# Patient Record
Sex: Female | Born: 1935 | Race: White | Hispanic: No | Smoking: Never smoker
Health system: Southern US, Community
[De-identification: ages and names within clinical notes are randomized; demographics above are authoritative.]

## PROBLEM LIST (undated history)

## (undated) DIAGNOSIS — M1990 Unspecified osteoarthritis, unspecified site: Secondary | ICD-10-CM

## (undated) DIAGNOSIS — E785 Hyperlipidemia, unspecified: Secondary | ICD-10-CM

## (undated) DIAGNOSIS — I10 Essential (primary) hypertension: Secondary | ICD-10-CM

## (undated) HISTORY — DX: Essential (primary) hypertension: I10

## (undated) HISTORY — DX: Unspecified osteoarthritis, unspecified site: M19.90

## (undated) HISTORY — DX: Hyperlipidemia, unspecified: E78.5

## (undated) HISTORY — PX: ABDOMINAL HYSTERECTOMY: SHX81

## (undated) HISTORY — PX: BLADDER SUSPENSION: SHX72

---

## 2004-05-26 ENCOUNTER — Ambulatory Visit: Payer: Self-pay | Admitting: Family Medicine

## 2004-10-06 ENCOUNTER — Ambulatory Visit: Payer: Self-pay | Admitting: Family Medicine

## 2004-11-21 ENCOUNTER — Ambulatory Visit: Payer: Self-pay | Admitting: Gastroenterology

## 2005-05-27 ENCOUNTER — Ambulatory Visit: Payer: Self-pay | Admitting: Unknown Physician Specialty

## 2005-09-03 ENCOUNTER — Ambulatory Visit: Payer: Self-pay | Admitting: Family Medicine

## 2005-10-07 ENCOUNTER — Ambulatory Visit: Payer: Self-pay | Admitting: Family Medicine

## 2005-10-12 ENCOUNTER — Encounter: Payer: Self-pay | Admitting: Family Medicine

## 2005-10-12 ENCOUNTER — Ambulatory Visit: Payer: Self-pay | Admitting: Family Medicine

## 2005-10-12 ENCOUNTER — Other Ambulatory Visit: Admission: RE | Admit: 2005-10-12 | Discharge: 2005-10-12 | Payer: Self-pay | Admitting: Family Medicine

## 2005-10-19 ENCOUNTER — Ambulatory Visit: Payer: Self-pay | Admitting: Family Medicine

## 2005-10-20 ENCOUNTER — Ambulatory Visit: Payer: Self-pay | Admitting: Family Medicine

## 2005-12-02 ENCOUNTER — Ambulatory Visit: Payer: Self-pay | Admitting: Family Medicine

## 2006-06-30 ENCOUNTER — Ambulatory Visit: Payer: Self-pay | Admitting: Family Medicine

## 2006-07-06 ENCOUNTER — Ambulatory Visit: Payer: Self-pay | Admitting: Family Medicine

## 2006-07-27 ENCOUNTER — Ambulatory Visit: Payer: Self-pay | Admitting: Internal Medicine

## 2006-10-07 ENCOUNTER — Telehealth: Payer: Self-pay | Admitting: Family Medicine

## 2006-10-08 ENCOUNTER — Telehealth: Payer: Self-pay | Admitting: Family Medicine

## 2007-07-21 ENCOUNTER — Ambulatory Visit: Payer: Self-pay | Admitting: Internal Medicine

## 2008-12-27 ENCOUNTER — Ambulatory Visit: Payer: Self-pay | Admitting: Internal Medicine

## 2009-03-06 ENCOUNTER — Ambulatory Visit: Payer: Self-pay | Admitting: Unknown Physician Specialty

## 2009-03-19 ENCOUNTER — Ambulatory Visit: Payer: Self-pay | Admitting: Unknown Physician Specialty

## 2009-12-25 ENCOUNTER — Ambulatory Visit: Payer: Self-pay | Admitting: Internal Medicine

## 2010-01-07 ENCOUNTER — Ambulatory Visit: Payer: Self-pay | Admitting: Internal Medicine

## 2010-01-15 ENCOUNTER — Ambulatory Visit: Payer: Self-pay | Admitting: Internal Medicine

## 2010-01-22 ENCOUNTER — Ambulatory Visit: Payer: Self-pay | Admitting: Internal Medicine

## 2011-02-17 DIAGNOSIS — N390 Urinary tract infection, site not specified: Secondary | ICD-10-CM | POA: Insufficient documentation

## 2011-02-17 DIAGNOSIS — N3281 Overactive bladder: Secondary | ICD-10-CM | POA: Insufficient documentation

## 2011-10-20 ENCOUNTER — Ambulatory Visit: Payer: Self-pay | Admitting: Unknown Physician Specialty

## 2011-10-20 LAB — CREATININE, SERUM: Creatinine: 0.82 mg/dL (ref 0.60–1.30)

## 2011-12-03 ENCOUNTER — Ambulatory Visit: Payer: Self-pay | Admitting: Unknown Physician Specialty

## 2011-12-04 LAB — PATHOLOGY REPORT

## 2012-06-23 ENCOUNTER — Ambulatory Visit: Payer: Self-pay | Admitting: Ophthalmology

## 2012-07-04 ENCOUNTER — Ambulatory Visit: Payer: Self-pay | Admitting: Ophthalmology

## 2012-08-15 ENCOUNTER — Ambulatory Visit: Payer: Self-pay | Admitting: Ophthalmology

## 2013-09-15 DIAGNOSIS — R0602 Shortness of breath: Secondary | ICD-10-CM | POA: Insufficient documentation

## 2013-10-23 ENCOUNTER — Ambulatory Visit: Payer: Self-pay | Admitting: Unknown Physician Specialty

## 2013-10-24 LAB — PATHOLOGY REPORT

## 2013-11-27 DIAGNOSIS — N3946 Mixed incontinence: Secondary | ICD-10-CM | POA: Insufficient documentation

## 2013-11-27 DIAGNOSIS — R159 Full incontinence of feces: Secondary | ICD-10-CM | POA: Insufficient documentation

## 2014-02-02 DIAGNOSIS — M4807 Spinal stenosis, lumbosacral region: Secondary | ICD-10-CM | POA: Insufficient documentation

## 2014-02-02 DIAGNOSIS — M5416 Radiculopathy, lumbar region: Secondary | ICD-10-CM | POA: Insufficient documentation

## 2014-05-22 DIAGNOSIS — N3941 Urge incontinence: Secondary | ICD-10-CM | POA: Insufficient documentation

## 2014-05-22 DIAGNOSIS — N952 Postmenopausal atrophic vaginitis: Secondary | ICD-10-CM | POA: Insufficient documentation

## 2014-05-22 DIAGNOSIS — N816 Rectocele: Secondary | ICD-10-CM | POA: Insufficient documentation

## 2014-08-10 NOTE — Op Note (Signed)
PATIENT NAME:  Darlene SpellerHARRISON, Shylynn M MR#:  [id number] DATE OF BIRTH:  [birth year]  DATE OF PROCEDURE:  07/04/2012  PREOPERATIVE DIAGNOSIS:  Cataract, right eye.  POSTOPERATIVE DIAGNOSIS:  Cataract, right eye.   PROCEDURE PERFORMED:  Extracapsular cataract extraction using phacoemulsification with placement Alcon Alcon [id number], 21.0 diopter posterior chamber lens, serial number [id number]   SURGEON:  Maylon PeppersSteven A. Naftoli Penny, MD   ANESTHESIA:  4% lidocaine and 0.75% Marcaine in a 50-50 mixture with 10 units/mL of Hylenex added, given as a peribulbar.   ANESTHESIOLOGIST:  Gwenyth BenderJames Adams, MD  COMPLICATIONS:  None.   ESTIMATED BLOOD LOSS:  Less than 1 mL.   DESCRIPTION OF PROCEDURE:  The patient was brought to the operating room and given a peribulbar block.  The patient was then prepped and draped in the usual fashion.  The vertical rectus muscles were imbricated using 5-0 silk sutures.  These sutures were then clamped to the sterile drapes as bridle sutures.  A limbal peritomy was performed extending two clock hours and hemostasis was obtained with cautery.  A partial thickness scleral groove was made at the surgical limbus and dissected anteriorly in a lamellar dissection using an Alcon crescent knife.  The anterior chamber was entered superonasally with a Superblade and through the lamellar dissection with a 2.6 mm keratome.  DisCoVisc was used to replace the aqueous and a continuous tear capsulorrhexis was carried out.  Hydrodissection and hydrodelineation were carried out with balanced salt and a 27 gauge cannula.  The nucleus was rotated to confirm the effectiveness of the hydrodissection.  Phacoemulsification was carried out using a divide-and-conquer technique.  Total ultrasound time was 1 minute and 33 seconds with an average power of 23.9% and CDE 37.39 and no suture was placed.  Irrigation/aspiration was used to remove the residual cortex.  DisCoVisc was used to inflate the capsule and the  internal incision was enlarged to 3 mm with the crescent knife.  The intraocular lens was folded and inserted into the capsular bag using the AcrySert Delivery System.  Irrigation/aspiration was used to remove the residual DisCoVisc.  Miostat was injected into the anterior chamber through the paracentesis track to inflate the anterior chamber and induce miosis.  The wound was checked for leaks and none were found. The conjunctiva was closed with cautery and the bridle sutures were removed.  Two drops of 0.3% Vigamox were placed on the eye.   An eye shield was placed on the eye.  The patient was discharged to the recovery room in good condition.   ____________________________ Maylon PeppersSteven A. Panagiotis Oelkers, MD sad:si D: 07/04/2012 14:39:53 ET T: 07/04/2012 21:35:00 ET JOB#: [id number]  cc: Viviann SpareSteven A. Luan Urbani, MD, <Dictator> Erline LevineSTEVEN A Chaise Mahabir MD ELECTRONICALLY SIGNED 07/11/2012 14:15

## 2014-08-10 NOTE — Op Note (Signed)
PATIENT NAME:  Darlene Gibson, Darlene Gibson MR#:  [id number] DATE OF BIRTH:  Sep 10, 1932  DATE OF PROCEDURE:  08/15/2012  PREOPERATIVE DIAGNOSIS:  Cataract, left eye.    POSTOPERATIVE DIAGNOSIS:  Cataract, left eye.  PROCEDURE PERFORMED:  Extracapsular cataract extraction using phacoemulsification with placement of an Alcon SN6CWS, 21.5-diopter posterior chamber lens, serial #[id number].  SURGEON:  Maylon PeppersSteven A. Lester Crickenberger, MD  ASSISTANT:  None.  ANESTHESIA:  4% lidocaine and 0.75% Marcaine in a 50/50 mixture with 10 units/mL of Hylenex added, given as a peribulbar.  ANESTHESIOLOGIST:  Dr. Pernell DupreAdams.  COMPLICATIONS:  None.  ESTIMATED BLOOD LOSS:  Less than 1 ml.  DESCRIPTION OF PROCEDURE:  The patient was brought to the operating room and given a peribulbar block.  The patient was then prepped and draped in the usual fashion.  The vertical rectus muscles were imbricated using 5-0 silk sutures.  These sutures were then clamped to the sterile drapes as bridle sutures.  A limbal peritomy was performed extending two clock hours and hemostasis was obtained with cautery.  A partial thickness scleral groove was made at the surgical limbus and dissected anteriorly in a lamellar dissection using an Alcon crescent knife.  The anterior chamber was entered supero-temporally with a Superblade and through the lamellar dissection with a 2.6 mm keratome.  DisCoVisc was used to replace the aqueous and a continuous tear capsulorrhexis was carried out.  Hydrodissection and hydrodelineation were carried out with balanced salt and a 27 gauge canula.  The nucleus was rotated to confirm the effectiveness of the hydrodissection.  Phacoemulsification was carried out using a divide-and-conquer technique.  Total ultrasound time was 1 minute and 27 seconds with an average power of 26.1 percent and CDE of 39.88.  Irrigation/aspiration was used to remove the residual cortex.  DisCoVisc was used to inflate the capsule and the internal  incision was enlarged to 3 mm with the crescent knife.  The intraocular lens was folded and inserted into the capsular bag using the AcrySert delivery system. Irrigation/aspiration was used to remove the residual DisCoVisc.  Miostat was injected into the anterior chamber through the paracentesis track to inflate the anterior chamber and induce miosis.  The wound was checked for leaks and none were found. The conjunctiva was closed with cautery and the bridle sutures were removed.  Two drops of 0.3% Vigamox were placed on the eye.   An eye shield was placed on the eye.  The patient was discharged to the recovery room in good condition.  ____________________________ Maylon PeppersSteven A. Shelisha Gautier, MD sad:sb D: 08/15/2012 14:04:14 ET T: 08/15/2012 14:47:11 ET JOB#: [id number]  cc: Viviann SpareSteven A. Tiani Stanbery, MD, <Dictator> Erline LevineSTEVEN A Camika Marsico MD ELECTRONICALLY SIGNED 08/22/2012 9:47

## 2014-09-03 DIAGNOSIS — E782 Mixed hyperlipidemia: Secondary | ICD-10-CM | POA: Insufficient documentation

## 2014-11-13 DIAGNOSIS — M47812 Spondylosis without myelopathy or radiculopathy, cervical region: Secondary | ICD-10-CM | POA: Insufficient documentation

## 2014-11-13 DIAGNOSIS — M5030 Other cervical disc degeneration, unspecified cervical region: Secondary | ICD-10-CM | POA: Insufficient documentation

## 2014-11-13 DIAGNOSIS — M62838 Other muscle spasm: Secondary | ICD-10-CM | POA: Insufficient documentation

## 2015-01-16 ENCOUNTER — Other Ambulatory Visit: Payer: Self-pay | Admitting: Internal Medicine

## 2015-01-16 DIAGNOSIS — Z1231 Encounter for screening mammogram for malignant neoplasm of breast: Secondary | ICD-10-CM

## 2015-01-18 ENCOUNTER — Ambulatory Visit: Payer: Self-pay | Attending: Internal Medicine

## 2015-01-21 ENCOUNTER — Other Ambulatory Visit: Payer: Self-pay | Admitting: Internal Medicine

## 2015-01-21 ENCOUNTER — Ambulatory Visit
Admission: RE | Admit: 2015-01-21 | Discharge: 2015-01-21 | Disposition: A | Discharge status: Home / Self Care | Payer: Medicare Other | Source: Ambulatory Visit | Attending: Internal Medicine | Admitting: Internal Medicine

## 2015-01-21 DIAGNOSIS — Z1231 Encounter for screening mammogram for malignant neoplasm of breast: Secondary | ICD-10-CM | POA: Insufficient documentation

## 2015-03-29 ENCOUNTER — Telehealth (HOSPITAL_COMMUNITY): Payer: Self-pay | Admitting: Physical Medicine and Rehabilitation

## 2015-03-29 ENCOUNTER — Other Ambulatory Visit: Payer: Self-pay | Admitting: Physical Medicine and Rehabilitation

## 2015-03-29 DIAGNOSIS — M5416 Radiculopathy, lumbar region: Secondary | ICD-10-CM

## 2015-04-19 ENCOUNTER — Ambulatory Visit: Payer: Medicare Other

## 2015-04-22 ENCOUNTER — Ambulatory Visit: Payer: Medicare Other

## 2015-04-30 ENCOUNTER — Ambulatory Visit
Admission: RE | Admit: 2015-04-30 | Discharge: 2015-04-30 | Disposition: A | Discharge status: Home / Self Care | Payer: Medicare Other | Source: Ambulatory Visit | Attending: Physical Medicine and Rehabilitation | Admitting: Physical Medicine and Rehabilitation

## 2015-04-30 DIAGNOSIS — M5126 Other intervertebral disc displacement, lumbar region: Secondary | ICD-10-CM | POA: Insufficient documentation

## 2015-04-30 DIAGNOSIS — M5416 Radiculopathy, lumbar region: Secondary | ICD-10-CM | POA: Diagnosis present

## 2015-09-24 DIAGNOSIS — I10 Essential (primary) hypertension: Secondary | ICD-10-CM | POA: Insufficient documentation

## 2016-01-20 DIAGNOSIS — K580 Irritable bowel syndrome with diarrhea: Secondary | ICD-10-CM | POA: Insufficient documentation

## 2016-02-13 ENCOUNTER — Ambulatory Visit: Payer: Self-pay

## 2016-02-21 ENCOUNTER — Encounter: Payer: Self-pay | Admitting: Urology

## 2016-02-21 ENCOUNTER — Ambulatory Visit (INDEPENDENT_AMBULATORY_CARE_PROVIDER_SITE_OTHER): Payer: Medicare Other | Admitting: Urology

## 2016-02-21 ENCOUNTER — Encounter: Payer: Self-pay | Admitting: Family Medicine

## 2016-02-21 ENCOUNTER — Other Ambulatory Visit: Payer: Self-pay | Admitting: Family Medicine

## 2016-02-21 VITALS — BP 146/80 | HR 73 | Ht 66.0 in | Wt 185.6 lb

## 2016-02-21 DIAGNOSIS — K580 Irritable bowel syndrome with diarrhea: Secondary | ICD-10-CM | POA: Diagnosis not present

## 2016-02-21 DIAGNOSIS — N3281 Overactive bladder: Secondary | ICD-10-CM

## 2016-02-21 DIAGNOSIS — N390 Urinary tract infection, site not specified: Secondary | ICD-10-CM | POA: Diagnosis not present

## 2016-02-21 LAB — URINALYSIS, COMPLETE
Bilirubin, UA: NEGATIVE
Glucose, UA: NEGATIVE
Ketones, UA: NEGATIVE
Nitrite, UA: POSITIVE — AB
PH UA: 6.5 (ref 5.0–7.5)
PROTEIN UA: NEGATIVE
Specific Gravity, UA: 1.02 (ref 1.005–1.030)
UUROB: 0.2 mg/dL (ref 0.2–1.0)

## 2016-02-21 LAB — BLADDER SCAN AMB NON-IMAGING: SCAN RESULT: 19

## 2016-02-21 LAB — MICROSCOPIC EXAMINATION: Epithelial Cells (non renal): >10 /hpf — AB (ref 0–10)

## 2016-02-21 MED ORDER — MIRABEGRON ER 25 MG PO TB24: 25.0000 mg | ORAL_TABLET | Freq: Every day | ORAL | 11 refills | Status: DC

## 2016-02-21 NOTE — Progress Notes (Signed)
02/21/2016 9:07 AM   Darlene SkinnerPhoebe Jana HalfM Gibson [birth year] [id number]  Referring provider: Theadore NanKimberly A Mills, NP [phone] [facility] [address] [place], [place] [address]  Chief Complaint  Patient presents with  . New Patient (Initial Visit)    recurrent UTI    HPI: The patient is an 82 year old female with PMH of sling in 1996 and IBS who presents for evaluation of recurrent urinary tract infections. The patient however feels that she is here to discuss her diarrhea symptoms. Her biggest complaint is suprapubic discomfort and diarrhea tendons after eating which she finds socially bothersome. She does not think that she has seen a gastroenterologist before.  In regards to her urinary symptoms, she states that she feels like she has a UTI at least once a month which she describes her symptoms being suprapubic discomfort. Her other urinary complaints at this time is urinary frequency with urge incontinence. She finds this mildly bothersome but not nearly as bothersome as her diarrhea.  In review of her culture data, she has had multiple urine cultures signs within the last year., However she has only had 1 positive culture which was in December 2016 which grew Escherichia coli. All remaining cultures were negative or contaminated.    PVR: 19 cc  PMH: Past Medical History:  Diagnosis Date  . Arthritis   . Hyperlipidemia     Surgical History: Past Surgical History:  Procedure Laterality Date  . BLADDER SUSPENSION      Home Medications:    Medication List       Accurate as of 02/21/16  9:07 AM. Always use your most recent med list.          acetaminophen 325 MG tablet Commonly known as:  TYLENOL Take 325 mg by mouth every 6 (six) hours as needed.   amitriptyline 25 MG tablet Commonly known as:  ELAVIL Take by mouth.   loperamide 2 MG tablet Commonly known as:  IMODIUM A-D Take 2 mg by mouth 4 (four) times daily as needed for diarrhea or loose stools.     mirabegron ER 25 MG Tb24 tablet Commonly known as:  MYRBETRIQ Take 1 tablet (25 mg total) by mouth daily.   VITAMIN D-1000 MAX ST 1000 units tablet Generic drug:  Cholecalciferol Take by mouth.       Allergies:  Allergies  Allergen Reactions  . Penicillins Other (See Comments)    Hallucination  . Pravastatin     Other reaction(s): Hallucination  . Prednisone Other (See Comments)    My body felt like it was on fire  . Other Rash    Poison oak/ivy    Family History: Family History  Problem Relation Age of Onset  . Breast cancer Mother 2570  . Hematuria Neg Hx   . Renal cancer Neg Hx     Social History:  has no tobacco, alcohol, and drug history on file.  ROS: UROLOGY Frequent Urination?: Yes Hard to postpone urination?: Yes Burning/pain with urination?: Yes Get up at night to urinate?: Yes Leakage of urine?: Yes Urine stream starts and stops?: Yes Trouble starting stream?: No Do you have to strain to urinate?: No Blood in urine?: No Urinary tract infection?: Yes Sexually transmitted disease?: No Injury to kidneys or bladder?: No Painful intercourse?: No Weak stream?: No Currently pregnant?: No Vaginal bleeding?: No Last menstrual period?: No  Gastrointestinal Nausea?: No Vomiting?: No Indigestion/heartburn?: No Diarrhea?: Yes Constipation?: No  Constitutional Fever: No Night sweats?: No Weight loss?: No Fatigue?: Yes  Skin Skin rash/lesions?: No Itching?: No  Eyes Blurred vision?: Yes Double vision?: No  Ears/Nose/Throat Sore throat?: No Sinus problems?: Yes  Hematologic/Lymphatic Swollen glands?: No Easy bruising?: Yes  Cardiovascular Leg swelling?: No Chest pain?: No  Respiratory Cough?: Yes Shortness of breath?: Yes  Endocrine Excessive thirst?: Yes  Musculoskeletal Back pain?: Yes Joint pain?: Yes  Neurological Headaches?: No Dizziness?: No  Psychologic Depression?: No Anxiety?: No  Physical Exam: BP (!)  146/80   Pulse 73   Ht 5\' 6"  (1.676 m)   Wt 185 lb 9.6 oz (84.2 kg)   BMI 29.96 kg/m   Constitutional:  Alert and oriented, No acute distress. HEENT: [place] AT, moist mucus membranes.  Trachea midline, no masses. Cardiovascular: No clubbing, cyanosis, or edema. Respiratory: Normal respiratory effort, no increased work of breathing. GI: Abdomen is soft, nontender, nondistended, no abdominal masses GU: No CVA tenderness.  Skin: No rashes, bruises or suspicious lesions. Lymph: No cervical or inguinal adenopathy. Neurologic: Grossly intact, no focal deficits, moving all 4 extremities. Psychiatric: Normal mood and affect.  Laboratory Data: No results found for: WBC, HGB, HCT, MCV, PLT  Lab Results  Component Value Date   CREATININE 0.82 10/20/2011    No results found for: PSA  No results found for: TESTOSTERONE  No results found for: HGBA1C  Urinalysis No results found for: COLORURINE, APPEARANCEUR, LABSPEC, PHURINE, GLUCOSEU, HGBUR, BILIRUBINUR, KETONESUR, PROTEINUR, UROBILINOGEN, NITRITE, LEUKOCYTESUR   Assessment & Plan:   1. OAB 2. IBS I do not think the patient has recurrent urinary tract infections based on her symptoms and culture results. Her suprapubic discomfort that she describes is likely related to her irritable bowel syndrome. She does suffer from urinary urgency with urge incontinence. I have started her on Myrbetriq 25 mg daily. This should help with her OAB symptoms. Gastroenterology referral may be helpful for management of her suprapubic discomfort and diarrhea.  She'll follow up in 3 months to see how Myrbetriq helps her urinary urgency with incontinence.  Return in about 3 months (around 05/23/2016).  Hildred LaserBrian James Cniyah Sproull, MD  [facility] [address], [address] [place], [place] [address] [phone]

## 2016-02-26 ENCOUNTER — Telehealth: Payer: Self-pay

## 2016-02-26 NOTE — Telephone Encounter (Signed)
PA for myrbetriq DENIED. Insurance wants pt to try other medications.

## 2016-05-25 ENCOUNTER — Ambulatory Visit: Payer: Medicare Other

## 2016-08-27 ENCOUNTER — Other Ambulatory Visit: Payer: Self-pay | Admitting: Internal Medicine

## 2016-08-27 DIAGNOSIS — K5792 Diverticulitis of intestine, part unspecified, without perforation or abscess without bleeding: Secondary | ICD-10-CM

## 2016-09-03 ENCOUNTER — Ambulatory Visit
Admission: RE | Admit: 2016-09-03 | Discharge: 2016-09-03 | Disposition: A | Discharge status: Home / Self Care | Payer: Medicare Other | Source: Ambulatory Visit | Attending: Internal Medicine | Admitting: Internal Medicine

## 2016-09-03 DIAGNOSIS — Z9049 Acquired absence of other specified parts of digestive tract: Secondary | ICD-10-CM | POA: Insufficient documentation

## 2016-09-03 DIAGNOSIS — K5792 Diverticulitis of intestine, part unspecified, without perforation or abscess without bleeding: Secondary | ICD-10-CM | POA: Diagnosis present

## 2016-09-03 DIAGNOSIS — K769 Liver disease, unspecified: Secondary | ICD-10-CM | POA: Insufficient documentation

## 2016-09-03 DIAGNOSIS — Z90710 Acquired absence of both cervix and uterus: Secondary | ICD-10-CM | POA: Diagnosis not present

## 2016-09-03 DIAGNOSIS — K5730 Diverticulosis of large intestine without perforation or abscess without bleeding: Secondary | ICD-10-CM | POA: Insufficient documentation

## 2016-09-03 MED ORDER — IOPAMIDOL (ISOVUE-300) INJECTION 61%
100.0000 mL | Freq: Once | INTRAVENOUS | Status: AC | PRN
  Administered 2016-09-03: 100 mL via INTRAVENOUS

## 2017-03-08 ENCOUNTER — Other Ambulatory Visit: Payer: Self-pay | Admitting: Nurse Practitioner

## 2017-03-08 DIAGNOSIS — K769 Liver disease, unspecified: Secondary | ICD-10-CM

## 2017-03-15 ENCOUNTER — Ambulatory Visit
Admission: RE | Admit: 2017-03-15 | Discharge: 2017-03-15 | Disposition: A | Discharge status: Home / Self Care | Payer: Medicare Other | Source: Ambulatory Visit | Attending: Nurse Practitioner | Admitting: Nurse Practitioner

## 2017-03-15 DIAGNOSIS — K769 Liver disease, unspecified: Secondary | ICD-10-CM | POA: Insufficient documentation

## 2017-03-15 LAB — POCT I-STAT CREATININE: Creatinine, Ser: 0.7 mg/dL (ref 0.44–1.00)

## 2017-03-15 MED ORDER — GADOBENATE DIMEGLUMINE 529 MG/ML IV SOLN
17.0000 mL | Freq: Once | INTRAVENOUS | Status: AC | PRN
  Administered 2017-03-15: 17 mL via INTRAVENOUS

## 2017-05-28 ENCOUNTER — Encounter: Payer: Self-pay | Admitting: Emergency Medicine

## 2017-05-28 ENCOUNTER — Other Ambulatory Visit: Payer: Self-pay

## 2017-05-28 ENCOUNTER — Emergency Department
Admission: EM | Admit: 2017-05-28 | Discharge: 2017-05-29 | Disposition: A | Discharge status: Home / Self Care | Payer: Medicare Other | Attending: Emergency Medicine | Admitting: Emergency Medicine

## 2017-05-28 DIAGNOSIS — R350 Frequency of micturition: Secondary | ICD-10-CM | POA: Diagnosis not present

## 2017-05-28 DIAGNOSIS — R945 Abnormal results of liver function studies: Secondary | ICD-10-CM | POA: Diagnosis not present

## 2017-05-28 DIAGNOSIS — I10 Essential (primary) hypertension: Secondary | ICD-10-CM | POA: Diagnosis not present

## 2017-05-28 DIAGNOSIS — R110 Nausea: Secondary | ICD-10-CM | POA: Diagnosis present

## 2017-05-28 DIAGNOSIS — R51 Headache: Secondary | ICD-10-CM | POA: Diagnosis not present

## 2017-05-28 DIAGNOSIS — R7989 Other specified abnormal findings of blood chemistry: Secondary | ICD-10-CM

## 2017-05-28 DIAGNOSIS — R1030 Lower abdominal pain, unspecified: Secondary | ICD-10-CM | POA: Diagnosis not present

## 2017-05-28 DIAGNOSIS — N3000 Acute cystitis without hematuria: Secondary | ICD-10-CM | POA: Insufficient documentation

## 2017-05-28 LAB — URINALYSIS, COMPLETE (UACMP) WITH MICROSCOPIC
Bilirubin Urine: NEGATIVE
GLUCOSE, UA: NEGATIVE mg/dL
HGB URINE DIPSTICK: NEGATIVE
KETONES UR: 5 mg/dL — AB
NITRITE: NEGATIVE
PH: 5 (ref 5.0–8.0)
Protein, ur: 30 mg/dL — AB
Specific Gravity, Urine: 1.021 (ref 1.005–1.030)

## 2017-05-28 LAB — CBC
HCT: 41.6 % (ref 35.0–47.0)
Hemoglobin: 13.8 g/dL (ref 12.0–16.0)
MCH: 29.1 pg (ref 26.0–34.0)
MCHC: 33.1 g/dL (ref 32.0–36.0)
MCV: 88.2 fL (ref 80.0–100.0)
PLATELETS: 200 10*3/uL (ref 150–440)
RBC: 4.72 MIL/uL (ref 3.80–5.20)
RDW: 14.1 % (ref 11.5–14.5)
WBC: 13.9 10*3/uL — AB (ref 3.6–11.0)

## 2017-05-28 LAB — COMPREHENSIVE METABOLIC PANEL
ALK PHOS: 62 U/L (ref 38–126)
ALT: 86 U/L — AB (ref 14–54)
ANION GAP: 10 (ref 5–15)
AST: 94 U/L — ABNORMAL HIGH (ref 15–41)
Albumin: 3.8 g/dL (ref 3.5–5.0)
BILIRUBIN TOTAL: 0.9 mg/dL (ref 0.3–1.2)
BUN: 14 mg/dL (ref 6–20)
CALCIUM: 8.9 mg/dL (ref 8.9–10.3)
CO2: 24 mmol/L (ref 22–32)
CREATININE: 0.8 mg/dL (ref 0.44–1.00)
Chloride: 103 mmol/L (ref 101–111)
GFR calc Af Amer: >60 mL/min (ref >60)
Glucose, Bld: 182 mg/dL — ABNORMAL HIGH (ref 65–99)
Potassium: 3.5 mmol/L (ref 3.5–5.1)
Sodium: 137 mmol/L (ref 135–145)
TOTAL PROTEIN: 6.6 g/dL (ref 6.5–8.1)

## 2017-05-28 LAB — LIPASE, BLOOD: Lipase: 25 U/L (ref 11–51)

## 2017-05-28 MED ORDER — ACETAMINOPHEN 500 MG PO TABS
1000.0000 mg | ORAL_TABLET | Freq: Once | ORAL | Status: DC
  Filled 2017-05-28: qty 2

## 2017-05-28 MED ORDER — CEPHALEXIN 500 MG PO CAPS: 500.0000 mg | ORAL_CAPSULE | Freq: Four times a day (QID) | ORAL | 0 refills | Status: AC

## 2017-05-28 MED ORDER — SODIUM CHLORIDE 0.9 % IV BOLUS (SEPSIS)
1000.0000 mL | Freq: Once | INTRAVENOUS | Status: AC
  Administered 2017-05-28: 1000 mL via INTRAVENOUS

## 2017-05-28 MED ORDER — ONDANSETRON 4 MG PO TBDP: 4.0000 mg | ORAL_TABLET | Freq: Three times a day (TID) | ORAL | 0 refills | Status: DC | PRN

## 2017-05-28 MED ORDER — DEXTROSE 5 % IV SOLN
1.0000 g | Freq: Once | INTRAVENOUS | Status: AC
  Administered 2017-05-28: 1 g via INTRAVENOUS
  Filled 2017-05-28: qty 10

## 2017-05-28 NOTE — ED Notes (Signed)
Patient and patient's daughter ambulated up to triage desk to ask about wait times. Patient informed that specific times could not be given, however, patient would be called when a room was available for her. Patient and daughter verbalized understanding.

## 2017-05-28 NOTE — ED Notes (Addendum)
Pt takes marobid for tx of UTI. Pt is NAD. Pt states she feels normal now. Pt daugther states she has not ate all day or drank anything. Pt has a hx of IBS and states it is not a flair up. Pt states HA that started 3 days ago. Pt states the HA feels like she feels pain in the "neck behind ear, and makes her head feel real big." Pt is NAD at this time.

## 2017-05-28 NOTE — ED Triage Notes (Signed)
Pt presents to the ER from home with complaints of nausea and urinary symptoms, reports she went to walking clinic and was prescribed Nitrofurantoin 100mg  BID reports took only 2 doses reports and started to feel sick on her stomach. Pt reports some headache and sinus pressure, body aches denise any temperature

## 2017-05-28 NOTE — ED Notes (Signed)
Patient ambulated up to triage desk, steady gait asking about wait times. Patient informed that there are still 2 patients in lobby to go back before her. Patient asked about specific wait time. This RN informed patient specific times cannot be given. Patient verbalized understanding.

## 2017-05-28 NOTE — ED Notes (Addendum)
Patient ambulated up to front desk asking about wait time and why patients were being pulled ahead of her. RN spoke with patient about triage process and room availability. Patient verbalized understanding.

## 2017-05-28 NOTE — ED Provider Notes (Signed)
[facility] Emergency Department Provider Note  ____________________________________________  Time seen: Approximately 10:40 PM  I have reviewed the triage vital signs and the nursing notes.   HISTORY  Chief Complaint Nausea; Facial Pain; and Urinary Frequency    HPI Darlene Gibson is a 84 y.o. female with a history of HTN, IBS, presenting with nausea after taking Macrobid.  The patient had some mild suprapubic discomfort yesterday and was seen by her PMD, prescribed with Macrobid for UTI.  She took her first dose at 3:30 PM, and developed fairly significant nausea without vomiting immediately after taking the tablet.  The same thing happened when she took the next tablet at 3:30 AM.  The patient denies any fevers, chills, diarrhea, or abdominal pain at this time.  She does have a associated headache and has not tried anything for her pain.  The patient states that while waiting in the waiting room, she was feeling significantly better and wanted to leave but her family would not let her.  Past Medical History:  Diagnosis Date  . Arthritis   . Hyperlipidemia     Patient Active Problem List   Diagnosis Date Noted  . Irritable bowel syndrome with diarrhea 01/20/2016  . Hypertension 09/24/2015  . DDD (degenerative disc disease), cervical 11/13/2014  . Muscle spasms of neck 11/13/2014  . Spondylosis of cervical region without myelopathy or radiculopathy 11/13/2014  . Hyperlipidemia, mixed 09/03/2014  . Rectocele 05/22/2014  . Urinary incontinence, urge 05/22/2014  . Vaginal atrophy 05/22/2014  . Foraminal stenosis of lumbosacral region 02/02/2014  . Lumbar radiculitis 02/02/2014  . Fecal incontinence 11/27/2013  . Mixed stress and urge urinary incontinence 11/27/2013  . SOB (shortness of breath) 09/15/2013  . OAB (overactive bladder) 02/17/2011  . Recurrent UTI 02/17/2011    Past Surgical History:  Procedure Laterality Date  . BLADDER SUSPENSION       Current Outpatient Rx  . Order #: [id number] Class: Historical Med  . Order #: [id number] Class: Historical Med  . Order #: [id number] Class: Print  . Order #: [id number] Class: Historical Med  . Order #: [id number] Class: Historical Med  . Order #: [id number] Class: Print  . Order #: [id number] Class: Print    Allergies Penicillins; Pravastatin; Prednisone; and Other  Family History  Problem Relation Age of Onset  . Breast cancer Mother 77  . Hematuria Neg Hx   . Renal cancer Neg Hx     Social History Social History   Tobacco Use  . Smoking status: Never Smoker  Substance Use Topics  . Alcohol use: No    Frequency: Never  . Drug use: No    Review of Systems Constitutional: No fever/chills.  P. Eyes: No visual changes. ENT: No sore throat. No congestion or rhinorrhea. Cardiovascular: Denies chest pain. Denies palpitations. Respiratory: Denies shortness of breath.  No cough. Gastrointestinal: Positive mild suprapubic abdominal pain.  Positive nausea, no vomiting.  No diarrhea.  No constipation. Genitourinary: Negative for dysuria. Musculoskeletal: Negative for back pain. Skin: Negative for rash. Neurological: Negative for headaches. No focal numbness, tingling or weakness.     ____________________________________________   PHYSICAL EXAM:  VITAL SIGNS: ED Triage Vitals  Enc Vitals Group     BP 05/28/17 1908 (!) 138/55     Pulse Rate 05/28/17 1908 87     Resp 05/28/17 1908 20     Temp 05/28/17 1908 98.9 F (37.2 C)     Temp Source 05/28/17 1908 Oral     SpO2 05/28/17 1908 96 %  Weight 05/28/17 1912 180 lb (81.6 kg)     Height 05/28/17 1912 5\' 6"  (1.676 m)     Head Circumference --      Peak Flow --      Pain Score 05/28/17 1911 6     Pain Loc --      Pain Edu? --      Excl. in GC? --     Constitutional: Alert and oriented. Well appearing and in no acute distress. Answers questions appropriately. Eyes: Conjunctivae are normal.  EOMI. No scleral  icterus. Head: Atraumatic. Nose: No congestion/rhinnorhea. Mouth/Throat: Mucous membranes are moist.  Neck: No stridor.  Supple.   Cardiovascular: Normal rate, regular rhythm. No murmurs, rubs or gallops.  Respiratory: Normal respiratory effort.  No accessory muscle use or retractions. Lungs CTAB.  No wheezes, rales or ronchi. Gastrointestinal: Obese.  Soft, nontender and nondistended.  No guarding or rebound.  No peritoneal signs. Musculoskeletal: No LE edema. Neurologic:  A&Ox3.  Speech is clear.  Face and smile are symmetric.  EOMI.  Moves all extremities well. Skin:  Skin is warm, dry and intact. No rash noted. Psychiatric: Mood and affect are normal. Speech and behavior are normal.  Normal judgement.  ____________________________________________   LABS (all labs ordered are listed, but only abnormal results are displayed)  Labs Reviewed  COMPREHENSIVE METABOLIC PANEL - Abnormal; Notable for the following components:      Result Value   Glucose, Bld 182 (*)    AST 94 (*)    ALT 86 (*)    All other components within normal limits  CBC - Abnormal; Notable for the following components:   WBC 13.9 (*)    All other components within normal limits  URINALYSIS, COMPLETE (UACMP) WITH MICROSCOPIC - Abnormal; Notable for the following components:   Color, Urine AMBER (*)    APPearance HAZY (*)    Ketones, ur 5 (*)    Protein, ur 30 (*)    Leukocytes, UA TRACE (*)    Bacteria, UA RARE (*)    Squamous Epithelial / LPF 6-30 (*)    All other components within normal limits  URINE CULTURE  LIPASE, BLOOD   ____________________________________________  EKG  Not indicated ____________________________________________  RADIOLOGY  No results found.  ____________________________________________   PROCEDURES  Procedure(s) performed: None  Procedures  Critical Care performed: No ____________________________________________   INITIAL IMPRESSION / ASSESSMENT AND PLAN / ED  COURSE  Pertinent labs & imaging results that were available during my care of the patient were reviewed by me and considered in my medical decision making (see chart for details).  84 y.o. female started on Macrobid for UTI yesterday with nausea associated with taking Macrobid.  Overall, the patient is hemodynamically stable and afebrile.  It is possible that she had a side effect to her Macrobid, or that she was feeling nauseated from her underlying infection.  Here, the patient does have ongoing bacteria in her urine and we will treat her with a gram of Rocephin.  She will be treated with Tylenol for her mild headache.  I do not see any evidence of acute intra-abdominal pathology, sepsis or severe infection.  We will have the patient discontinue her Macrobid and take Keflex for her UTI.  A urine culture has been sent.  The patient will be discharged with Zofran as well as return precautions and follow-up instructions.  She does know that her LFTs were mildly elevated today and that she will need to have these rechecked with  her primary care physician.  ____________________________________________  FINAL CLINICAL IMPRESSION(S) / ED DIAGNOSES  Final diagnoses:  Acute cystitis without hematuria  Nausea  Abnormal LFTs         NEW MEDICATIONS STARTED DURING THIS VISIT:  New Prescriptions   CEPHALEXIN (KEFLEX) 500 MG CAPSULE    Take 1 capsule (500 mg total) by mouth 4 (four) times daily for 5 days.   ONDANSETRON (ZOFRAN ODT) 4 MG DISINTEGRATING TABLET    Take 1 tablet (4 mg total) by mouth every 8 (eight) hours as needed for nausea or vomiting.      Rockne Menghini, MD 05/28/17 2249

## 2017-05-28 NOTE — Discharge Instructions (Signed)
Please drink plenty of fluids to stay well-hydrated.  STOP TAKING MACROBID and start taking Keflex for your urinary tract infection.  Please take the entire course of antibiotics, even if you are feeling better.  A urine culture has been sent; please have your primary care physician follow-up the results of your urine culture.  Your primary care physician will also need to recheck your liver function tests, which were slightly abnormal today.  You may take Tylenol or Motrin for fever or pain.  Return to the emergency department if you develop severe pain, fever, inability to keep down fluids, or any other symptoms concerning to you.

## 2017-05-28 NOTE — ED Notes (Signed)
Patient's daughter up to triage desk to ask about wait times. Patient's daughter informed that patient would be going to a room soon, as the rooms were currently being cleaned. Patient's daughter verbalized understanding.

## 2017-05-30 LAB — URINE CULTURE

## 2017-06-08 ENCOUNTER — Other Ambulatory Visit: Payer: Self-pay | Admitting: Internal Medicine

## 2017-06-08 DIAGNOSIS — R945 Abnormal results of liver function studies: Secondary | ICD-10-CM

## 2017-06-08 DIAGNOSIS — R7989 Other specified abnormal findings of blood chemistry: Secondary | ICD-10-CM

## 2017-06-15 ENCOUNTER — Ambulatory Visit
Admission: RE | Admit: 2017-06-15 | Discharge: 2017-06-15 | Disposition: A | Discharge status: Home / Self Care | Payer: Medicare Other | Source: Ambulatory Visit | Attending: Internal Medicine | Admitting: Internal Medicine

## 2017-06-15 DIAGNOSIS — R945 Abnormal results of liver function studies: Secondary | ICD-10-CM | POA: Diagnosis not present

## 2017-06-15 DIAGNOSIS — R932 Abnormal findings on diagnostic imaging of liver and biliary tract: Secondary | ICD-10-CM | POA: Insufficient documentation

## 2017-06-15 DIAGNOSIS — R7989 Other specified abnormal findings of blood chemistry: Secondary | ICD-10-CM

## 2017-09-08 ENCOUNTER — Other Ambulatory Visit: Payer: Self-pay | Admitting: Internal Medicine

## 2017-09-08 DIAGNOSIS — R1084 Generalized abdominal pain: Secondary | ICD-10-CM

## 2017-09-16 ENCOUNTER — Ambulatory Visit
Admission: RE | Admit: 2017-09-16 | Discharge: 2017-09-16 | Disposition: A | Discharge status: Home / Self Care | Payer: Medicare Other | Source: Ambulatory Visit | Attending: Internal Medicine | Admitting: Internal Medicine

## 2017-09-16 DIAGNOSIS — R1084 Generalized abdominal pain: Secondary | ICD-10-CM | POA: Diagnosis present

## 2017-09-16 DIAGNOSIS — K5730 Diverticulosis of large intestine without perforation or abscess without bleeding: Secondary | ICD-10-CM | POA: Diagnosis not present

## 2017-09-16 DIAGNOSIS — K769 Liver disease, unspecified: Secondary | ICD-10-CM | POA: Insufficient documentation

## 2017-09-16 DIAGNOSIS — Z9889 Other specified postprocedural states: Secondary | ICD-10-CM | POA: Insufficient documentation

## 2017-09-16 MED ORDER — IOPAMIDOL (ISOVUE-300) INJECTION 61%
100.0000 mL | Freq: Once | INTRAVENOUS | Status: AC | PRN
  Administered 2017-09-16: 100 mL via INTRAVENOUS

## 2018-11-01 ENCOUNTER — Other Ambulatory Visit
Admission: RE | Admit: 2018-11-01 | Discharge: 2018-11-01 | Disposition: A | Discharge status: Home / Self Care | Payer: Medicare Other | Source: Ambulatory Visit | Attending: Nurse Practitioner | Admitting: Nurse Practitioner

## 2018-11-01 DIAGNOSIS — K529 Noninfective gastroenteritis and colitis, unspecified: Secondary | ICD-10-CM | POA: Diagnosis present

## 2018-11-01 DIAGNOSIS — R195 Other fecal abnormalities: Secondary | ICD-10-CM | POA: Diagnosis present

## 2018-11-01 DIAGNOSIS — R1030 Lower abdominal pain, unspecified: Secondary | ICD-10-CM | POA: Diagnosis present

## 2018-11-04 LAB — CALPROTECTIN, FECAL: Calprotectin, Fecal: 227 ug/g — ABNORMAL HIGH (ref 0–120)

## 2018-11-12 ENCOUNTER — Other Ambulatory Visit
Admission: RE | Admit: 2018-11-12 | Discharge: 2018-11-12 | Disposition: A | Discharge status: Home / Self Care | Payer: Medicare Other | Source: Ambulatory Visit | Attending: Nurse Practitioner | Admitting: Nurse Practitioner

## 2018-11-12 DIAGNOSIS — R899 Unspecified abnormal finding in specimens from other organs, systems and tissues: Secondary | ICD-10-CM | POA: Insufficient documentation

## 2018-11-12 DIAGNOSIS — R197 Diarrhea, unspecified: Secondary | ICD-10-CM | POA: Insufficient documentation

## 2018-11-12 LAB — GASTROINTESTINAL PANEL BY PCR, STOOL (REPLACES STOOL CULTURE)

## 2018-11-12 LAB — C DIFFICILE QUICK SCREEN W PCR REFLEX
C Diff antigen: NEGATIVE
C Diff interpretation: NOT DETECTED
C Diff toxin: NEGATIVE

## 2019-01-07 ENCOUNTER — Emergency Department: Payer: Medicare Other

## 2019-01-07 ENCOUNTER — Emergency Department
Admission: EM | Admit: 2019-01-07 | Discharge: 2019-01-07 | Disposition: A | Discharge status: Home / Self Care | Payer: Medicare Other | Attending: Emergency Medicine | Admitting: Emergency Medicine

## 2019-01-07 ENCOUNTER — Other Ambulatory Visit: Payer: Self-pay

## 2019-01-07 DIAGNOSIS — I10 Essential (primary) hypertension: Secondary | ICD-10-CM | POA: Diagnosis not present

## 2019-01-07 DIAGNOSIS — N3090 Cystitis, unspecified without hematuria: Secondary | ICD-10-CM | POA: Diagnosis not present

## 2019-01-07 DIAGNOSIS — S0003XA Contusion of scalp, initial encounter: Secondary | ICD-10-CM

## 2019-01-07 DIAGNOSIS — R55 Syncope and collapse: Secondary | ICD-10-CM | POA: Diagnosis not present

## 2019-01-07 DIAGNOSIS — Y9389 Activity, other specified: Secondary | ICD-10-CM | POA: Diagnosis not present

## 2019-01-07 DIAGNOSIS — N3000 Acute cystitis without hematuria: Secondary | ICD-10-CM

## 2019-01-07 DIAGNOSIS — Y999 Unspecified external cause status: Secondary | ICD-10-CM | POA: Insufficient documentation

## 2019-01-07 DIAGNOSIS — Z79899 Other long term (current) drug therapy: Secondary | ICD-10-CM | POA: Insufficient documentation

## 2019-01-07 DIAGNOSIS — W19XXXA Unspecified fall, initial encounter: Secondary | ICD-10-CM

## 2019-01-07 DIAGNOSIS — Y92013 Bedroom of single-family (private) house as the place of occurrence of the external cause: Secondary | ICD-10-CM | POA: Diagnosis not present

## 2019-01-07 LAB — BASIC METABOLIC PANEL
Anion gap: 6 (ref 5–15)
BUN: 15 mg/dL (ref 8–23)
CO2: 28 mmol/L (ref 22–32)
Calcium: 8.9 mg/dL (ref 8.9–10.3)
Chloride: 107 mmol/L (ref 98–111)
Creatinine, Ser: 0.75 mg/dL (ref 0.44–1.00)
GFR calc Af Amer: >60 mL/min (ref >60)
GFR calc non Af Amer: >60 mL/min (ref >60)
Glucose, Bld: 118 mg/dL — ABNORMAL HIGH (ref 70–99)
Potassium: 3.8 mmol/L (ref 3.5–5.1)
Sodium: 141 mmol/L (ref 135–145)

## 2019-01-07 LAB — CBC WITH DIFFERENTIAL/PLATELET
Abs Immature Granulocytes: 0.03 10*3/uL (ref 0.00–0.07)
Basophils Absolute: 0 10*3/uL (ref 0.0–0.1)
Basophils Relative: 1 %
Eosinophils Absolute: 0.1 10*3/uL (ref 0.0–0.5)
Eosinophils Relative: 3 %
HCT: 39.9 % (ref 36.0–46.0)
Hemoglobin: 12.9 g/dL (ref 12.0–15.0)
Immature Granulocytes: 1 %
Lymphocytes Relative: 31 %
Lymphs Abs: 1.7 10*3/uL (ref 0.7–4.0)
MCH: 29.3 pg (ref 26.0–34.0)
MCHC: 32.3 g/dL (ref 30.0–36.0)
MCV: 90.5 fL (ref 80.0–100.0)
Monocytes Absolute: 0.5 10*3/uL (ref 0.1–1.0)
Monocytes Relative: 9 %
Neutro Abs: 3.1 10*3/uL (ref 1.7–7.7)
Neutrophils Relative %: 55 %
Platelets: 224 10*3/uL (ref 150–400)
RBC: 4.41 MIL/uL (ref 3.87–5.11)
RDW: 13.9 % (ref 11.5–15.5)
WBC: 5.5 10*3/uL (ref 4.0–10.5)
nRBC: 0 % (ref 0.0–0.2)

## 2019-01-07 LAB — URINALYSIS, COMPLETE (UACMP) WITH MICROSCOPIC
Bacteria, UA: NONE SEEN
Bilirubin Urine: NEGATIVE
Glucose, UA: NEGATIVE mg/dL
Hgb urine dipstick: NEGATIVE
Ketones, ur: NEGATIVE mg/dL
Nitrite: NEGATIVE
Protein, ur: NEGATIVE mg/dL
Specific Gravity, Urine: 1.01 (ref 1.005–1.030)
pH: 7 (ref 5.0–8.0)

## 2019-01-07 LAB — TROPONIN I (HIGH SENSITIVITY): Troponin I (High Sensitivity): 6 ng/L (ref <18)

## 2019-01-07 MED ORDER — CEPHALEXIN 500 MG PO CAPS: 500.0000 mg | ORAL_CAPSULE | Freq: Two times a day (BID) | ORAL | 0 refills | Status: AC

## 2019-01-07 MED ORDER — CEPHALEXIN 500 MG PO CAPS
500.0000 mg | ORAL_CAPSULE | Freq: Once | ORAL | Status: AC
  Administered 2019-01-07: 500 mg via ORAL
  Filled 2019-01-07: qty 1

## 2019-01-07 NOTE — ED Triage Notes (Signed)
Pt arrives to ED from home via [facility] with c/c of syncopal episode and mechanical fall on Thursday morning (3 days ago). Pts daughter checked BP at home today and found her to be hypertensive and insisted she come in. EMS found her A&Ox4, 189/79, p75 NSR, 98% on room air, temp 90.0 oral, CBG 145. Pt with "racoon eyes" post fall. Pt states she blacked out on Thursday morning around 0430 when walking to the bathroom and woke up on the floor with blood around her head. Upon arrival, pt A&Ox4, NAD, no respiratory Sx evident.

## 2019-01-07 NOTE — ED Provider Notes (Signed)
[facility] Emergency Department Provider Note   ____________________________________________   First MD Initiated Contact with Patient 01/07/19 2023     (approximate)  I have reviewed the triage vital signs and the nursing notes.   HISTORY  Chief Complaint Loss of Consciousness and Fall    HPI Darlene Gibson is a 85 y.o. female with past medical history of hypertension presents to the ED complaining of syncope and fall.  Patient reports that 3 nights ago she stood up from bed to go to the bathroom, began to feel very lightheaded and passed out.  She reports falling to the ground and hitting her head, woke up with some blood around her head.  She believes she may have lost consciousness for up to 1 hour.  She states she has been feeling okay since then, complains of mild pain along the right side of her head as well as some generalized weakness.  She denies any neck pain, chest pain, abdominal pain, hip pain, or extremity pain.  She also denies any fevers, chills, cough, chest pain, shortness of breath.  She does report some discomfort when urinating recently, denies any hematuria or flank pain.        Past Medical History:  Diagnosis Date  . Arthritis   . Hyperlipidemia     Patient Active Problem List   Diagnosis Date Noted  . Irritable bowel syndrome with diarrhea 01/20/2016  . Hypertension 09/24/2015  . DDD (degenerative disc disease), cervical 11/13/2014  . Muscle spasms of neck 11/13/2014  . Spondylosis of cervical region without myelopathy or radiculopathy 11/13/2014  . Hyperlipidemia, mixed 09/03/2014  . Rectocele 05/22/2014  . Urinary incontinence, urge 05/22/2014  . Vaginal atrophy 05/22/2014  . Foraminal stenosis of lumbosacral region 02/02/2014  . Lumbar radiculitis 02/02/2014  . Fecal incontinence 11/27/2013  . Mixed stress and urge urinary incontinence 11/27/2013  . SOB (shortness of breath) 09/15/2013  . OAB (overactive bladder)  02/17/2011  . Recurrent UTI 02/17/2011    Past Surgical History:  Procedure Laterality Date  . BLADDER SUSPENSION      Prior to Admission medications   Medication Sig Start Date End Date Taking? Authorizing Provider  acetaminophen (TYLENOL) 325 MG tablet Take 325 mg by mouth every 6 (six) hours as needed.    [provider]  amitriptyline (ELAVIL) 25 MG tablet Take by mouth. 01/28/16 01/27/17  [provider]  cephALEXin (KEFLEX) 500 MG capsule Take 1 capsule (500 mg total) by mouth 2 (two) times daily for 7 days. 01/07/19 01/14/19  Blake Divine, MD  Cholecalciferol (VITAMIN D-1000 MAX ST) 1000 units tablet Take by mouth.    [provider]  loperamide (IMODIUM A-D) 2 MG tablet Take 2 mg by mouth 4 (four) times daily as needed for diarrhea or loose stools.    [provider]  mirabegron ER (MYRBETRIQ) 25 MG TB24 tablet Take 1 tablet (25 mg total) by mouth daily. 02/21/16   Nickie Retort, MD  ondansetron (ZOFRAN ODT) 4 MG disintegrating tablet Take 1 tablet (4 mg total) by mouth every 8 (eight) hours as needed for nausea or vomiting. 05/28/17   Eula Listen, MD    Allergies Penicillins, Pravastatin, Prednisone, and Other  Family History  Problem Relation Age of Onset  . Breast cancer Mother 9  . Hematuria Neg Hx   . Renal cancer Neg Hx     Social History Social History   Tobacco Use  . Smoking status: Never Smoker  Substance Use  Topics  . Alcohol use: No    Frequency: Never  . Drug use: No    Review of Systems  Constitutional: No fever/chills.  Positive for generalized weakness. Eyes: No visual changes. ENT: No sore throat. Cardiovascular: Denies chest pain.  Positive for syncope. Respiratory: Denies shortness of breath. Gastrointestinal: No abdominal pain.  No nausea, no vomiting.  No diarrhea.  No constipation. Genitourinary: Positive for dysuria. Musculoskeletal: Negative for back pain. Skin: Negative for rash.  Neurological: Negative for headaches, focal weakness or numbness.  ____________________________________________   PHYSICAL EXAM:  VITAL SIGNS: ED Triage Vitals [01/07/19 2019]  Enc Vitals Group     BP (!) 187/77     Pulse Rate 78     Resp 20     Temp 98.4 F (36.9 C)     Temp Source Oral     SpO2 100 %     Weight      Height      Head Circumference      Peak Flow      Pain Score      Pain Loc      Pain Edu?      Excl. in GC?     Constitutional: Alert and oriented. Eyes: Conjunctivae are normal. Head: Small healing laceration to right parietal scalp with no associated erythema or warmth.  No scalp tenderness or step-offs.  No facial bony tenderness.  Ecchymosis underneath eyes bilaterally. Nose: No congestion/rhinnorhea. Mouth/Throat: Mucous membranes are moist. Neck: Normal ROM Cardiovascular: Normal rate, regular rhythm. Grossly normal heart sounds. Respiratory: Normal respiratory effort.  No retractions. Lungs CTAB. Gastrointestinal: Soft and nontender. No distention. Genitourinary: deferred Musculoskeletal: No lower extremity tenderness nor edema. Neurologic:  Normal speech and language. No gross focal neurologic deficits are appreciated. Skin:  Skin is warm, dry and intact. No rash noted. Psychiatric: Mood and affect are normal. Speech and behavior are normal.  ____________________________________________   LABS (all labs ordered are listed, but only abnormal results are displayed)  Labs Reviewed  BASIC METABOLIC PANEL - Abnormal; Notable for the following components:      Result Value   Glucose, Bld 118 (*)    All other components within normal limits  URINALYSIS, COMPLETE (UACMP) WITH MICROSCOPIC - Abnormal; Notable for the following components:   Color, Urine STRAW (*)    APPearance CLEAR (*)    Leukocytes,Ua MODERATE (*)    All other components within normal limits  CBC WITH DIFFERENTIAL/PLATELET  TROPONIN I (HIGH SENSITIVITY)    ____________________________________________  EKG  ED ECG REPORT I, Chesley Noonharles Domenico Achord, the attending physician, personally viewed and interpreted this ECG.   Date: 01/07/2019  EKG Time: 20:16  Rate: 81  Rhythm: normal sinus rhythm  Axis: Normal  Intervals:none  ST&T Change: None    PROCEDURES  Procedure(s) performed (including Critical Care):  Procedures   ____________________________________________   INITIAL IMPRESSION / ASSESSMENT AND PLAN / ED COURSE       85 year old female presents to the ED after syncopal episode and fall 3 nights ago.  Her syncopal episode seems less likely to be related to cardiac cause given it happened after she stood up and she had prodromal symptoms.  She also has no history of CHF or other risk factors for cardiogenic syncope beyond her age.  Will scan head and C-spine for traumatic injury, no apparent facial bony injury.  She otherwise does not appear to have any extremity injuries, has been walking without difficulty since the fall.  We will additionally screen  labs including UA given her urinary symptoms.  CT head and C-spine negative for acute process.  EKG without evidence of arrhythmia or ischemia.  Blood work is unremarkable, UA does show evidence of UTI.  Given patient's symptoms, will prescribe Keflex.  Counseled her to follow-up with PCP and return to the ED for new or worsening symptoms, patient agrees with plan.      ____________________________________________   FINAL CLINICAL IMPRESSION(S) / ED DIAGNOSES  Final diagnoses:  Syncope and collapse  Fall, initial encounter  Contusion of scalp, initial encounter  Acute cystitis without hematuria     ED Discharge Orders         Ordered    cephALEXin (KEFLEX) 500 MG capsule  2 times daily     01/07/19 2251           Note:  This document was prepared using Dragon voice recognition software and may include unintentional dictation errors.   Chesley Noon, MD 01/08/19  [phone]

## 2019-02-21 ENCOUNTER — Telehealth: Payer: Self-pay | Admitting: Urology

## 2019-02-21 NOTE — Telephone Encounter (Signed)
Based on this brief description, it sounds like her symptoms are not urologic in nature.  She is going to be evaluated for recurrent urinary tract infections for which she has been seen in the past.  Okay to move up appointment if there is availability with ANY provider, can be PA given that shes been seen in our office in the past.  Hollice Espy, MD

## 2019-02-21 NOTE — Telephone Encounter (Signed)
Pt's daughter called and states every time her mother has a bowel movement she has severe pain in her lower abdomen and has to go lay down. I advised her to follow up with her primary or Gastroenterologist. She was wanting her mother to be seen sooner than her appt on 03/02/2019.

## 2019-02-24 ENCOUNTER — Other Ambulatory Visit: Payer: Self-pay | Admitting: Internal Medicine

## 2019-02-24 DIAGNOSIS — R1032 Left lower quadrant pain: Secondary | ICD-10-CM

## 2019-03-02 ENCOUNTER — Encounter: Payer: Self-pay | Admitting: Urology

## 2019-03-02 ENCOUNTER — Other Ambulatory Visit: Payer: Self-pay

## 2019-03-02 ENCOUNTER — Ambulatory Visit: Payer: Medicare Other | Admitting: Urology

## 2019-03-02 VITALS — BP 143/72 | HR 78 | Ht 66.0 in | Wt 173.0 lb

## 2019-03-02 DIAGNOSIS — N390 Urinary tract infection, site not specified: Secondary | ICD-10-CM

## 2019-03-02 DIAGNOSIS — R350 Frequency of micturition: Secondary | ICD-10-CM | POA: Diagnosis not present

## 2019-03-02 DIAGNOSIS — R1030 Lower abdominal pain, unspecified: Secondary | ICD-10-CM

## 2019-03-02 LAB — URINALYSIS, COMPLETE
Bilirubin, UA: NEGATIVE
Glucose, UA: NEGATIVE
Ketones, UA: NEGATIVE
Nitrite, UA: NEGATIVE
Protein,UA: NEGATIVE
RBC, UA: NEGATIVE
Specific Gravity, UA: 1.025 (ref 1.005–1.030)
Urobilinogen, Ur: 0.2 mg/dL (ref 0.2–1.0)
pH, UA: 5.5 (ref 5.0–7.5)

## 2019-03-02 LAB — MICROSCOPIC EXAMINATION: RBC, Urine: NONE SEEN /hpf (ref 0–2)

## 2019-03-02 NOTE — Progress Notes (Signed)
03/02/2019 9:55 AM   Darlene Gibson Darlene Gibson 05/05/1932 [id number]  Referring provider: Marguarite Arbour, MD [address] [facility] [place],  [place] [address]  Chief Complaint  Patient presents with  . Urinary Tract Infection    HPI: 85 year old female who presents today for evaluation of possible recurrent urinary tract infections and chronic lower abdominal pain  For the past year, she is dropped off numerous urinalysis all of which appear relatively contaminated with moderate to many squamous epithelial cells.  She had 1 truly suspicious positive urinalysis on 01/26/2019 which was nitrate positive as well as multiple RBCs and white BCs with bacteria.  She ultimately grew E. Coli.  On this occasion, she was truly symptomatic with foul-smelling urine, worsening urgency frequency and dysuria.  Documentation.  All other urine cultures were negative.    She also reports that she had an episode of loss of consciousness in 01/07/2019.  Upon presentation to the emergency room, she is found to have 11-20 white blood cells per high-power field, no bacteria and no other urinary symptoms or findings.  She was treated for presumed urinary tract infection on this occasion and no urine culture was sent.  She was asymptomatic from a GU perspective.  She has chronic severe abdominal pain.  She reports that this has been going on for at least 1 year plus.  The pain is constant, aching, dull and radiates down her legs.  It improves with Tylenol.  No exacerbating symptoms.  Not relieved or exacerbated by voiding.  She has had numerous evaluations for this as well with multiple CT scan showing diverticulosis no other pathology including no GU pathology.  She has a chronic queasy feeling, no overt nausea but feels like "when you are pregnant".  She has another CT scan pending ordered on 03/07/2019.  She takes cholestyramine chronically for loose stool.  If she does not take this, she will have severe  diarrhea.  She has been diagnosed with irritable bowel syndrome with diarrhea in the past.  In terms of urination, she does have chronic urinary frequency.  She voids about every 2 hours both day and nighttime.  This is with some urgency.  No significant incontinence.  No significant dysuria or gross hematuria.  She was last seen a little over 3 years ago by Dr. Sherryl Barters.  At that time, she was complaining of OAB type symptoms.  She was given Myrbetriq samples and prescription.  She does not recall taking the medications.  Her insurance denied the prescription.  She does report that she is been having issues with hypertension.  Ms. Mellette was under the impression that she was seeing gastroenterologist today.  Despite me explaining at the beginning of the visit that I was a urologist, she continued believe that I was a gastroenterologist.  At the end of the visit, I reiterated that I was a urologist she became frustrated that she is not seeing a bowel doctor.  PMH: Past Medical History:  Diagnosis Date  . Arthritis   . Hyperlipidemia   . Hypertension     Surgical History: Past Surgical History:  Procedure Laterality Date  . BLADDER SUSPENSION      Home Medications:  Allergies as of 03/02/2019      Reactions   Penicillins Other (See Comments)   Hallucination   Nitrofurantoin Other (See Comments)   Pravastatin    Other reaction(s): Hallucination   Prednisone Other (See Comments)   My body felt like it was on  fire   Other Rash   Poison oak/ivy      Medication List       Accurate as of March 02, 2019  9:55 AM. If you have any questions, ask your nurse or doctor.        STOP taking these medications   amitriptyline 25 MG tablet Commonly known as: ELAVIL Stopped by: Hollice Espy, MD   loperamide 2 MG tablet Commonly known as: IMODIUM A-D Stopped by: Hollice Espy, MD   mirabegron ER 25 MG Tb24 tablet Commonly known as: MYRBETRIQ Stopped by: Hollice Espy, MD    ondansetron 4 MG disintegrating tablet Commonly known as: Zofran ODT Stopped by: Hollice Espy, MD     TAKE these medications   acetaminophen 325 MG tablet Commonly known as: TYLENOL Take 325 mg by mouth every 6 (six) hours as needed.   amLODipine 5 MG tablet Commonly known as: NORVASC Take by mouth.   cholestyramine 4 GM/DOSE powder Commonly known as: QUESTRAN Take by mouth.   Vitamin D-1000 Max St 25 MCG (1000 UT) tablet Generic drug: Cholecalciferol Take by mouth.       Allergies:  Allergies  Allergen Reactions  . Penicillins Other (See Comments)    Hallucination  . Nitrofurantoin Other (See Comments)  . Pravastatin     Other reaction(s): Hallucination  . Prednisone Other (See Comments)    My body felt like it was on fire  . Other Rash    Poison oak/ivy    Family History: Family History  Problem Relation Age of Onset  . Breast cancer Mother 43  . Hematuria Neg Hx   . Renal cancer Neg Hx     Social History:  reports that she has never smoked. She has never used smokeless tobacco. She reports that she does not drink alcohol or use drugs.  ROS: UROLOGY Frequent Urination?: Yes Hard to postpone urination?: No Burning/pain with urination?: No Get up at night to urinate?: Yes Leakage of urine?: Yes Urine stream starts and stops?: No Trouble starting stream?: No Do you have to strain to urinate?: No Blood in urine?: No Urinary tract infection?: Yes Sexually transmitted disease?: No Injury to kidneys or bladder?: No Painful intercourse?: No Weak stream?: No Currently pregnant?: No Vaginal bleeding?: No Last menstrual period?: n  Gastrointestinal Nausea?: No Vomiting?: No Indigestion/heartburn?: No Diarrhea?: No Constipation?: No  Constitutional Fever: No Night sweats?: No Weight loss?: No Fatigue?: No  Skin Skin rash/lesions?: No Itching?: No  Eyes Blurred vision?: No Double vision?: No  Ears/Nose/Throat Sore throat?: No Sinus  problems?: No  Hematologic/Lymphatic Swollen glands?: No Easy bruising?: No  Cardiovascular Leg swelling?: No Chest pain?: No  Respiratory Cough?: No Shortness of breath?: No  Endocrine Excessive thirst?: No  Musculoskeletal Back pain?: No Joint pain?: No  Neurological Headaches?: No Dizziness?: No  Psychologic Depression?: No Anxiety?: No  Physical Exam: BP (!) 143/72   Pulse 78   Ht 5\' 6"  (1.676 m)   Wt 173 lb (78.5 kg)   BMI 27.92 kg/m   Constitutional:  Alert and oriented, No acute distress. HEENT: [place] AT, moist mucus membranes.  Trachea midline, no masses. Cardiovascular: No clubbing, cyanosis, or edema. Respiratory: Normal respiratory effort, no increased work of breathing. GI: Abdomen is soft, nontender, nondistended, no abdominal masses GU: No CVA tenderness Skin: No rashes, bruises or suspicious lesions. Neurologic: Grossly intact, no focal deficits, moving all 4 extremities. Psychiatric: Normal mood and affect.  Laboratory Data: Lab Results  Component Value Date  WBC 5.5 01/07/2019   HGB 12.9 01/07/2019   HCT 39.9 01/07/2019   MCV 90.5 01/07/2019   PLT 224 01/07/2019    Lab Results  Component Value Date   CREATININE 0.75 01/07/2019   Urinalysis UA today with 1+ leuk on dip, microscopic completely negative.  Pertinent Imaging: Previous CT scans (2019, 2018)were personally reviewed today, no GU pathology appreciated  Assessment & Plan:    1. Recurrent UTI Review of extensive data reveals that she is only had 1 true urinary tract infection over the last 1+ year, all other urinalysis appear to be contaminated with multiple squamous epithelial cells.  Would strongly recommend continue to only check urinalysis when she is truly symptomatic with dysuria and send culture data.  Ideally, should weeks catheter specimen but understand this is likely not realistic in a primary care office.  Would not recommend any further intervention is true  infections are in fact infrequent.  Would strongly recommend against treating asymptomatic bacteriuria or a mildly suspicious urinalysis suggestive of contamination.  No underlying anatomic pathology or stones on recent scans. - Urinalysis, Complete  2. Lower abdominal pain Chronic continuous lower abdominal pain, unlikely GU in origin based on her history  Patient desires to follow-up with gastroenterology which seems reasonable  CT scan pending, for back if any GU pathology is identified.  3. Urinary frequency Stable urinary frequency every 2 hours  She does seem to have some memory and attention issues.  Given her age, strongly recommend against anticholinergics.  She has previously tried Myrbetriq but not unable to afford this medication.  She is also been having hypertension.  Overall, she seems to not be particularly bothered by her urinary symptoms.  F/u prn  Vanna ScotlandAshley Kaspian Muccio, MD  [facility] [address], [address] [place], [place] [id number] [phone]

## 2019-03-07 ENCOUNTER — Ambulatory Visit
Admission: RE | Admit: 2019-03-07 | Discharge: 2019-03-07 | Disposition: A | Discharge status: Home / Self Care | Payer: Medicare Other | Source: Ambulatory Visit | Attending: Internal Medicine | Admitting: Internal Medicine

## 2019-03-07 ENCOUNTER — Other Ambulatory Visit: Payer: Self-pay

## 2019-03-07 DIAGNOSIS — R1032 Left lower quadrant pain: Secondary | ICD-10-CM | POA: Insufficient documentation

## 2019-03-07 MED ORDER — IOHEXOL 300 MG/ML  SOLN
100.0000 mL | Freq: Once | INTRAMUSCULAR | Status: AC | PRN
  Administered 2019-03-07: 100 mL via INTRAVENOUS

## 2019-05-08 ENCOUNTER — Other Ambulatory Visit: Payer: Self-pay | Admitting: Physical Medicine and Rehabilitation

## 2019-05-08 DIAGNOSIS — M5416 Radiculopathy, lumbar region: Secondary | ICD-10-CM

## 2019-05-16 ENCOUNTER — Other Ambulatory Visit: Payer: Self-pay

## 2019-05-16 ENCOUNTER — Ambulatory Visit
Admission: RE | Admit: 2019-05-16 | Discharge: 2019-05-16 | Disposition: A | Discharge status: Home / Self Care | Payer: Medicare Other | Source: Ambulatory Visit | Attending: Physical Medicine and Rehabilitation | Admitting: Physical Medicine and Rehabilitation

## 2019-05-16 DIAGNOSIS — M5416 Radiculopathy, lumbar region: Secondary | ICD-10-CM | POA: Insufficient documentation

## 2019-12-27 NOTE — Progress Notes (Signed)
12/28/2019 10:22 AM   Darlene Gibson Darlene Gibson [birth year] [id number]  Referring provider: Marguarite Arbour, MD [address] [facility] [place],  [place] [address] Chief Complaint  Patient presents with  . Recurrent UTI    HPI: Darlene Gibson is a 86 y.o. female who returns for an annual follow up of recurrent UTI.  She was last seen in clinic on 03/02/2019. See note for details. Please see previous notes for details.  CT A/P with contrast on 03/07/2019 noted colonic diverticulosis. No radiographic evidence of diverticulitis or other acute findings. Stable small benign hepatic hemangioma.  She last saw her PCP Dr. Judithann Gibson on 11/08/2019. Recent UA on 12/07/2019 noted 10-20 WBCs, few bacteria and moderate squamous. Associated urine culture was negative.  On each occasion she was treated for presumed infection.  Last week, she called in to Dr. Judithann Gibson mentioning that her symptoms have recurred.  He prescribed her another round of Bactrim which she is taking currently.  No UA or urine culture was obtained.  PVR 0 mL. She reports having "constant infections". She had some lower pelvic pain. She has radiating pain down her the front of her legs. She feels nauseated frequently. She has constant nocturia. Denies dysuria. She took antibiotic this morning. She reports that when she does not take antibiotics her whole body aches.   She reports that she has arthritis.   Patient reports being fully COVID-19 vaccinated. She is awaiting the booster shot.   (+) Urine culture: 03/11/2015: E. Coli 09/09/2017: E. Coli 12/15/2017: E. Coli 01/26/2019: Bea Laura Coli   PMH: Past Medical History:  Diagnosis Date  . Arthritis   . Hyperlipidemia   . Hypertension     Surgical History: Past Surgical History:  Procedure Laterality Date  . BLADDER SUSPENSION      Home Medications:  Allergies as of 12/28/2019      Reactions   Penicillins Other (See Comments)   Hallucination   Nitrofurantoin  Other (See Comments)   Pravastatin    Other reaction(s): Hallucination   Prednisone Other (See Comments)   My body felt like it was on fire   Other Rash   Poison oak/ivy      Medication List       Accurate as of December 28, 2019 10:22 AM. If you have any questions, ask your nurse or doctor.        STOP taking these medications   cholestyramine 4 GM/DOSE powder Commonly known as: QUESTRAN Stopped by: Darlene Scotland, MD     TAKE these medications   acetaminophen 325 MG tablet Commonly known as: TYLENOL Take 325 mg by mouth every 6 (six) hours as needed.   amLODipine 5 MG tablet Commonly known as: NORVASC Take by mouth.   amLODipine 10 MG tablet Commonly known as: NORVASC Take 10 mg by mouth daily.   imipramine 25 MG tablet Commonly known as: TOFRANIL Take by mouth.   neomycin-polymyxin-hydrocortisone 3.5-10000-1 OTIC suspension Commonly known as: CORTISPORIN SMARTSIG:In Ear(s)   sulfamethoxazole-trimethoprim 800-160 MG tablet Commonly known as: BACTRIM DS Take 1 tablet by mouth 2 (two) times daily.   Vitamin D-1000 Max St 25 MCG (1000 UT) tablet Generic drug: Cholecalciferol Take by mouth.       Allergies:  Allergies  Allergen Reactions  . Penicillins Other (See Comments)    Hallucination  . Nitrofurantoin Other (See Comments)  . Pravastatin     Other reaction(s): Hallucination  . Prednisone Other (See Comments)    My body felt  like it was on fire  . Other Rash    Poison oak/ivy    Family History: Family History  Problem Relation Age of Onset  . Breast cancer Mother 6  . Hematuria Neg Hx   . Renal cancer Neg Hx     Social History:  reports that she has never smoked. She has never used smokeless tobacco. She reports that she does not drink alcohol and does not use drugs.   Physical Exam: BP (!) 143/72   Pulse 70   Ht 5\' 6"  (1.676 m)   Wt 168 lb (76.2 kg)   BMI 27.12 kg/m   Constitutional:  Alert and oriented, No acute  distress. HEENT: [place] AT, moist mucus membranes.  Trachea midline, no masses. Cardiovascular: No clubbing, cyanosis, or edema. Respiratory: Normal respiratory effort, no increased work of breathing. Skin: No rashes, bruises or suspicious lesions. Neurologic: Grossly intact, no focal deficits, moving all 4 extremities. Psychiatric: Normal mood and affect.   Urinalysis 6-10 WBCs other wise negative.   Pertinent Imaging: PVR minimal   Assessment & Plan:    1. rUTI UA shows 6-10 WBCs otherwise negative.   All urine samples dropped off to Dr. were reviewed -none of these show infection and her symptoms are not specific for UTI which include lower abdominal pain radiating down her legs without dysuria or change in frequency  The patient is noted to have only 1 true infection over the last calendar year  Patient was advised to stop Bactrim course.   I have absolutely urged her to come to our clinic if and when she thinks she is having infection and stop calling in for antibiotics.  There is extremely strong likelihood that she is being treated inappropriately with the wrong diagnosis  If and when she has signs or symptoms that she relates to an bladder infection, would like her to come in for pelvic exam and catheterized specimen (usually provides contaminated specimens) for definitive diagnosis  [facility] [address] [place], [place] [phone]  I, [phone], am acting as a scribe for Dr. Theador Gibson.  I have reviewed the above documentation for accuracy and completeness, and I agree with the above.   Darlene Scotland, MD

## 2019-12-28 ENCOUNTER — Other Ambulatory Visit: Payer: Self-pay

## 2019-12-28 ENCOUNTER — Ambulatory Visit: Payer: Medicare Other | Admitting: Urology

## 2019-12-28 VITALS — BP 143/72 | HR 70 | Ht 66.0 in | Wt 168.0 lb

## 2019-12-28 DIAGNOSIS — N390 Urinary tract infection, site not specified: Secondary | ICD-10-CM | POA: Diagnosis not present

## 2019-12-29 ENCOUNTER — Telehealth: Payer: Self-pay | Admitting: Radiology

## 2019-12-29 LAB — MICROSCOPIC EXAMINATION

## 2019-12-29 LAB — URINALYSIS, COMPLETE
Bilirubin, UA: NEGATIVE
Glucose, UA: NEGATIVE
Ketones, UA: NEGATIVE
Nitrite, UA: NEGATIVE
Protein,UA: NEGATIVE
RBC, UA: NEGATIVE
Specific Gravity, UA: 1.025 (ref 1.005–1.030)
Urobilinogen, Ur: 0.2 mg/dL (ref 0.2–1.0)
pH, UA: 5.5 (ref 5.0–7.5)

## 2019-12-29 NOTE — Telephone Encounter (Signed)
Patient reports Dr Barnabas Lister placed a sling many years ago and she wonders if this could be contributing to her symptoms. Please return call to [phone].

## 2020-01-01 NOTE — Telephone Encounter (Signed)
Very unlikely.  Unless the sling has eroded into the urethra / bladder.  Without blood or true infection, this is not likely.    Vanna Scotland, MD

## 2020-01-01 NOTE — Telephone Encounter (Signed)
Pt aware . Will follow up as needed

## 2020-10-04 ENCOUNTER — Other Ambulatory Visit: Payer: Self-pay | Admitting: Internal Medicine

## 2020-10-04 DIAGNOSIS — R1032 Left lower quadrant pain: Secondary | ICD-10-CM

## 2020-10-17 ENCOUNTER — Other Ambulatory Visit: Payer: Self-pay

## 2020-10-17 ENCOUNTER — Ambulatory Visit
Admission: RE | Admit: 2020-10-17 | Discharge: 2020-10-17 | Disposition: A | Discharge status: Home / Self Care | Payer: Medicare Other | Source: Ambulatory Visit | Attending: Internal Medicine | Admitting: Internal Medicine

## 2020-10-17 DIAGNOSIS — R1032 Left lower quadrant pain: Secondary | ICD-10-CM | POA: Diagnosis not present

## 2021-04-28 NOTE — Progress Notes (Signed)
04/29/21 10:16 AM   Darlene Gibson Darlene Gibson [birth year] [id number]  Referring provider:  Marval Regal, NP [place],  [place] [address] Chief Complaint  Patient presents with   Recurrent UTI     HPI: Darlene Gibson is a 88 y.o.female with a personal history of recurrent UTIs who presents today for evaluation of overactive bladder.   She was last seen in clinic on 12/28/2019 for annual follow-up regarding recurrent UTI.   She was seen and evaluated by urogynecology in 11/2020 for similar issues. She was referred back to Urology by Dr. Sarajane Jews for OAB. She was prescribed vaginal estrogen cream.   She recently had upper tract imagine in the form of CT abdomen and pelvis on 10/17/2020 for left pelvic pain it revealed no hydronephrosis or nephrolithiasis on either side. The visualized ureters and urinary bladder appear unremarkable.  She is accompanied by a health proxy. Proxy reports that she has a history of UTIs. She was recently seen for nausea and vomiting and she was placed on antibiotics for presumed diverticulitis, Cipro Flagyl. She has reportedly experiencing tar-like stool. She unsure when she last had a colonoscopy.   She believes she has a UTI today based on the way she feels and how it is similar to when she had previous UTIs. This consists of pain across lower stomach and running down her legs.    PMH: Past Medical History:  Diagnosis Date   Arthritis    Hyperlipidemia    Hypertension     Surgical History: Past Surgical History:  Procedure Laterality Date   BLADDER SUSPENSION      Home Medications:  Allergies as of 04/29/2021       Reactions   Penicillins Other (See Comments)   Hallucination   Nitrofurantoin Other (See Comments)   Pravastatin    Other reaction(s): Hallucination   Prednisone Other (See Comments)   My body felt like it was on fire   Other Rash   Poison oak/ivy        Medication List        Accurate as of  April 29, 2021 10:16 AM. If you have any questions, ask your nurse or doctor.          STOP taking these medications    sulfamethoxazole-trimethoprim 800-160 MG tablet Commonly known as: BACTRIM DS Stopped by: Hollice Espy, MD       TAKE these medications    acetaminophen 325 MG tablet Commonly known as: TYLENOL Take 325 mg by mouth every 6 (six) hours as needed.   amLODipine 10 MG tablet Commonly known as: NORVASC Take 10 mg by mouth daily. What changed: Another medication with the same name was removed. Continue taking this medication, and follow the directions you see here. Changed by: Hollice Espy, MD   Cholecalciferol 25 MCG (1000 UT) tablet Take by mouth.   cholestyramine 4 GM/DOSE powder Commonly known as: QUESTRAN SMARTSIG:4 Gram(s) By Mouth Daily   ciprofloxacin 500 MG tablet Commonly known as: CIPRO Take 500 mg by mouth 2 (two) times daily.   cyanocobalamin 1000 MCG/ML injection Commonly known as: (VITAMIN B-12) Inject 1,000 mcg into the muscle every 30 (thirty) days.   famotidine 40 MG tablet Commonly known as: PEPCID Take 40 mg by mouth at bedtime.   imipramine 25 MG tablet Commonly known as: TOFRANIL Take by mouth.   neomycin-polymyxin-hydrocortisone 3.5-10000-1 OTIC suspension Commonly known as: CORTISPORIN SMARTSIG:In Ear(s)   Premarin vaginal cream Generic drug: conjugated estrogens Place  vaginally.   tolterodine 4 MG 24 hr capsule Commonly known as: DETROL LA Take by mouth.        Allergies:  Allergies  Allergen Reactions   Penicillins Other (See Comments)    Hallucination   Nitrofurantoin Other (See Comments)   Pravastatin     Other reaction(s): Hallucination   Prednisone Other (See Comments)    My body felt like it was on fire   Other Rash    Poison oak/ivy    Family History: Family History  Problem Relation Age of Onset   Breast cancer Mother 37   Hematuria Neg Hx    Renal cancer Neg Hx     Social  History:  reports that she has never smoked. She has never used smokeless tobacco. She reports that she does not drink alcohol and does not use drugs.   Physical Exam: BP 128/85    Pulse 77    Ht 5\' 5"  (1.651 m)    Wt 170 lb (77.1 kg)    BMI 28.29 kg/m   Constitutional:  Alert and oriented, No acute distress.  Coming by daughter today. HEENT: [place] AT, moist mucus membranes.  Trachea midline, no masses. Cardiovascular: No clubbing, cyanosis, or edema. Respiratory: Normal respiratory effort, no increased work of breathing. Skin: No rashes, bruises or suspicious lesions. Neurologic: Grossly intact, no focal deficits, moving all 4 extremities. Psychiatric: Normal mood and affect.  Laboratory Data:  Lab Results  Component Value Date   CREATININE 0.75 01/07/2019   Urinalysis 3-10 RBCs and 6-10 WBCs  Assessment & Plan:    1. Recurrent UTIs - Her symptoms are not consistent with UTI, rather are most consistent with her ongoing abdominal issues including abdominal pain with fecal incontinence and diarrhea -Would strongly recommend continue to only check urinalysis when she is truly symptomatic with dysuria and send culture data. - Would not recommend any further intervention is true infections are in fact infrequent.  Would strongly recommend against treating asymptomatic bacteriuria or a mildly suspicious urinalysis suggestive of contamination. - Highly recommend she continue the use of vaginal estrogen cream   2. Microscopic hematuria  - 3-10 RBCs in urine today along with 6-10 WBC - Will further evaluate with repeat UA in 2 weeks. If microscopic blood persist would recommend cystoscopy. Will hold-off on imaging due to her age and comorbidity although could consider renal ultrasound if microscopic hematuria persists or worsens  3. OAB  - Less concerned with urinary symptoms today  - She does seem to have some memory and attention issues.  Given her age, strongly recommend against  anticholinergics.  She has previously tried Myrbetriq but not unable to afford this medication. I considered Gemtesa in the future as would hope to avoid anticholinergics given her age and memory issues  4. GI distress  - Recommend she follow-up with Gastroenterology  -Complete course of Cipro Flagyl as prescribed  Return in 2 weeks for catheterized UA with Darlene Council, PA-C  I,Kailey Littlejohn,acting as a scribe for Hollice Espy, MD.,have documented all relevant documentation on the behalf of Hollice Espy, MD,as directed by  Hollice Espy, MD while in the presence of Hollice Espy, MD.  I have reviewed the above documentation for accuracy and completeness, and I agree with the above.   Hollice Espy, MD   [facility] [address] [place], [place] [address] [phone]

## 2021-04-29 ENCOUNTER — Ambulatory Visit: Payer: Medicare Other | Admitting: Urology

## 2021-04-29 ENCOUNTER — Other Ambulatory Visit: Payer: Self-pay

## 2021-04-29 VITALS — BP 128/85 | HR 77 | Ht 65.0 in | Wt 170.0 lb

## 2021-04-29 DIAGNOSIS — N390 Urinary tract infection, site not specified: Secondary | ICD-10-CM

## 2021-04-29 LAB — MICROSCOPIC EXAMINATION

## 2021-04-29 LAB — URINALYSIS, COMPLETE
Bilirubin, UA: NEGATIVE
Glucose, UA: NEGATIVE
Ketones, UA: NEGATIVE
Nitrite, UA: NEGATIVE
Protein,UA: NEGATIVE
Specific Gravity, UA: 1.025 (ref 1.005–1.030)
Urobilinogen, Ur: 0.2 mg/dL (ref 0.2–1.0)
pH, UA: 6 (ref 5.0–7.5)

## 2021-05-12 ENCOUNTER — Other Ambulatory Visit
Admission: RE | Admit: 2021-05-12 | Discharge: 2021-05-12 | Disposition: A | Discharge status: Home / Self Care | Payer: Medicare Other | Source: Ambulatory Visit | Attending: Nurse Practitioner | Admitting: Nurse Practitioner

## 2021-05-12 DIAGNOSIS — R197 Diarrhea, unspecified: Secondary | ICD-10-CM | POA: Diagnosis present

## 2021-05-12 LAB — GASTROINTESTINAL PANEL BY PCR, STOOL (REPLACES STOOL CULTURE)

## 2021-05-12 LAB — C DIFFICILE QUICK SCREEN W PCR REFLEX
C Diff antigen: NEGATIVE
C Diff interpretation: NOT DETECTED
C Diff toxin: NEGATIVE

## 2021-05-13 ENCOUNTER — Ambulatory Visit: Payer: Medicare Other | Admitting: Urology

## 2021-08-26 ENCOUNTER — Other Ambulatory Visit: Payer: Self-pay | Admitting: Nurse Practitioner

## 2021-08-26 DIAGNOSIS — R197 Diarrhea, unspecified: Secondary | ICD-10-CM

## 2021-09-03 ENCOUNTER — Ambulatory Visit
Admission: RE | Admit: 2021-09-03 | Discharge: 2021-09-03 | Disposition: A | Discharge status: Home / Self Care | Payer: Medicare Other | Source: Ambulatory Visit | Attending: Nurse Practitioner | Admitting: Nurse Practitioner

## 2021-09-03 DIAGNOSIS — K449 Diaphragmatic hernia without obstruction or gangrene: Secondary | ICD-10-CM | POA: Diagnosis not present

## 2021-09-03 DIAGNOSIS — R197 Diarrhea, unspecified: Secondary | ICD-10-CM | POA: Diagnosis present

## 2021-09-03 DIAGNOSIS — K5730 Diverticulosis of large intestine without perforation or abscess without bleeding: Secondary | ICD-10-CM | POA: Insufficient documentation

## 2021-09-03 DIAGNOSIS — N3281 Overactive bladder: Secondary | ICD-10-CM | POA: Diagnosis not present

## 2021-09-03 DIAGNOSIS — I700 Atherosclerosis of aorta: Secondary | ICD-10-CM | POA: Diagnosis not present

## 2021-09-03 DIAGNOSIS — N390 Urinary tract infection, site not specified: Secondary | ICD-10-CM | POA: Diagnosis not present

## 2021-09-03 MED ORDER — IOHEXOL 300 MG/ML  SOLN
100.0000 mL | Freq: Once | INTRAMUSCULAR | Status: AC | PRN
  Administered 2021-09-03: 100 mL via INTRAVENOUS

## 2021-11-06 IMAGING — CT CT ABD-PELV W/O CM
2 of 4 series · 16 of 46 positions shown, 18 images · non-contrast
Comparison: CT abdomen pelvis dated 03/07/2019.

CLINICAL DATA: 88-year-old female with left pelvic pain.

EXAM:
CT ABDOMEN AND PELVIS WITHOUT CONTRAST
TECHNIQUE: Multidetector CT imaging of the abdomen and pelvis was performed
following the standard protocol without IV contrast.

[Series 2: axials routine abdomen pelvis without 5.00 · axial · non-contrast · 0.70mm/px · z∈[-1726,-1286]mm · 13 of 96 slices shown, 15 images]
[im 4/96  soft-tissue]
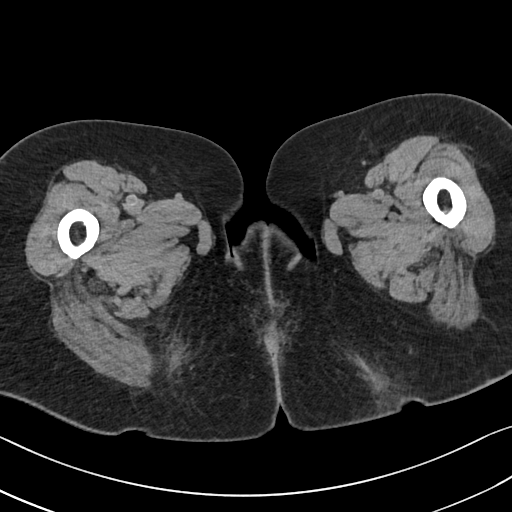
[im 4/96  bone]
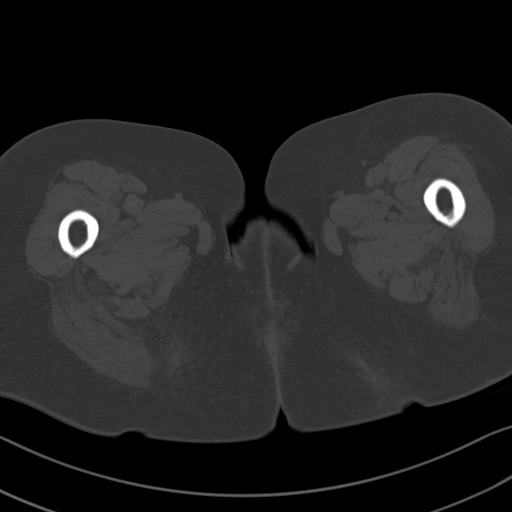
[im 12/96  soft-tissue]
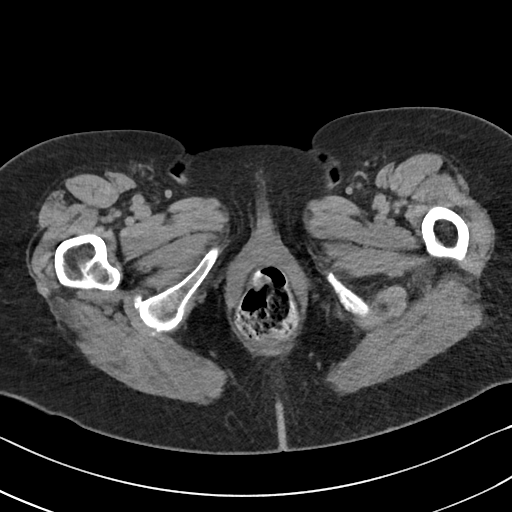
[im 20/96  soft-tissue]
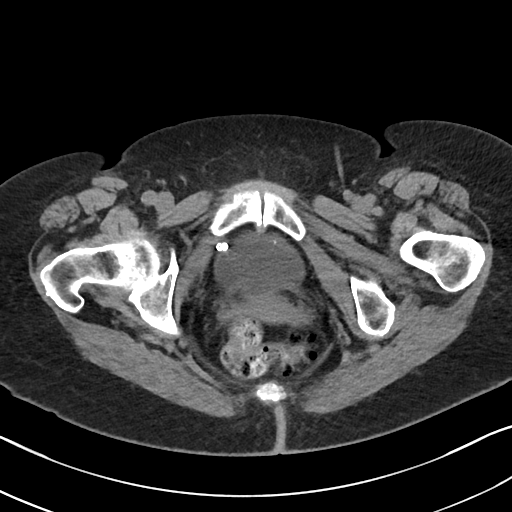
[im 27/96  soft-tissue]
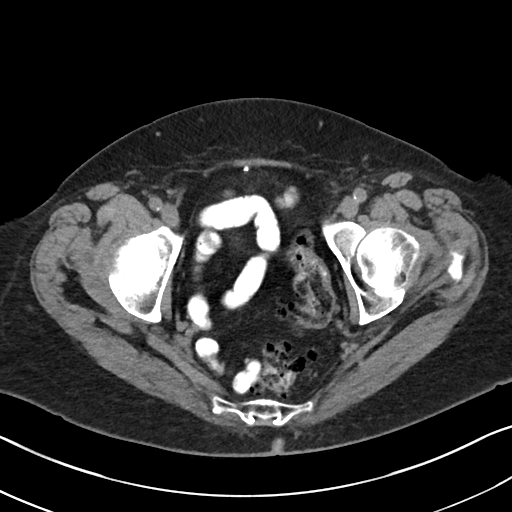
[im 35/96  soft-tissue]
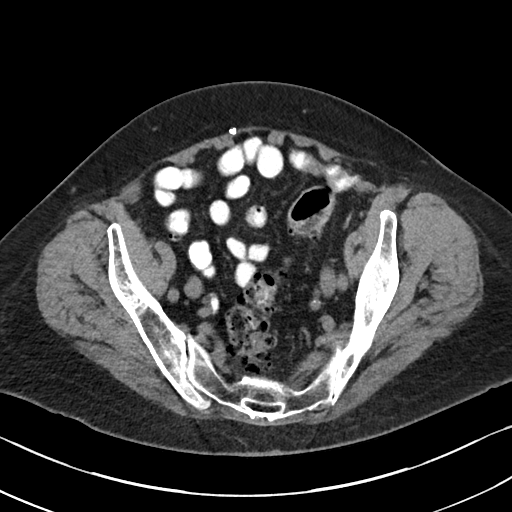
[im 42/96  soft-tissue]
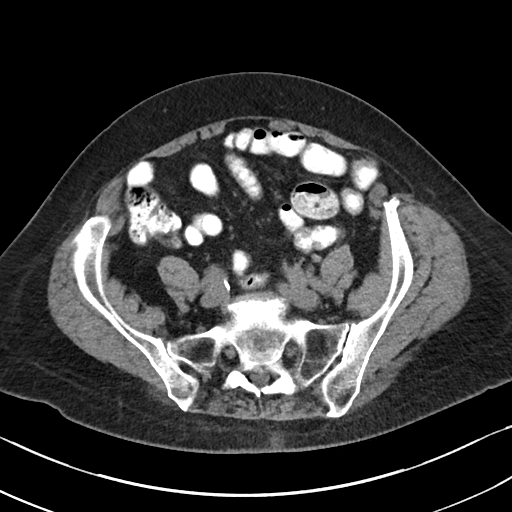
[im 50/96  soft-tissue]
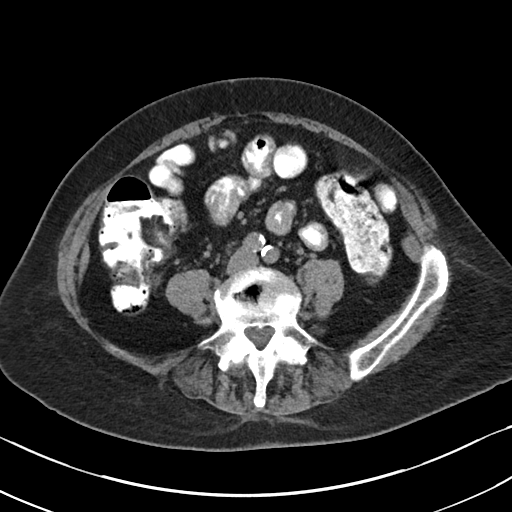
[im 54/96  soft-tissue]
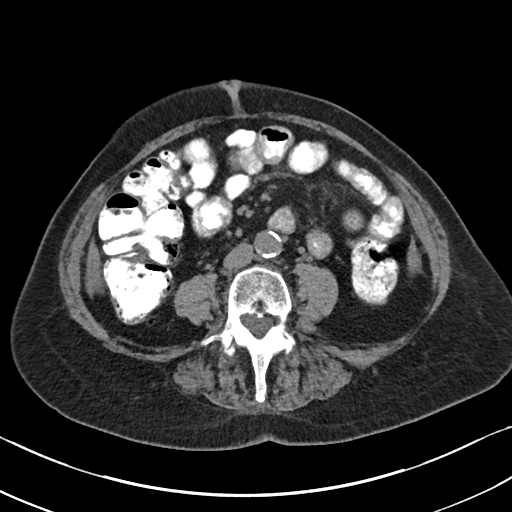
[im 61/96  soft-tissue]
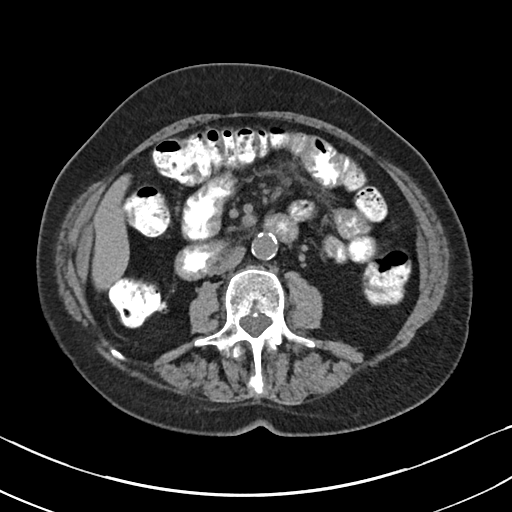
[im 61/96  bone]
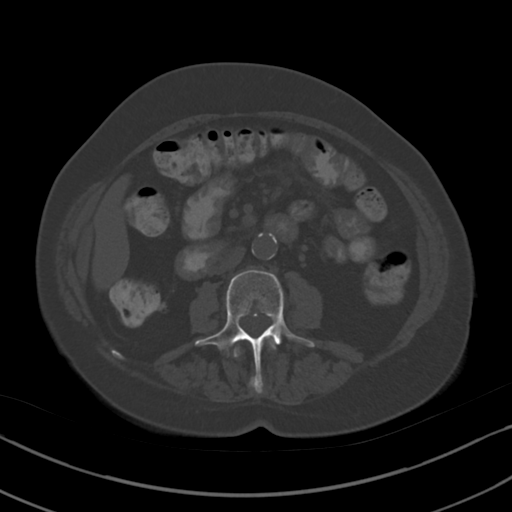
[im 69/96  soft-tissue]
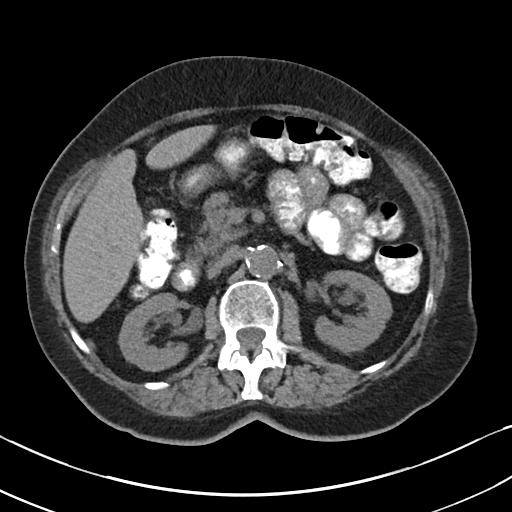
[im 77/96  soft-tissue]
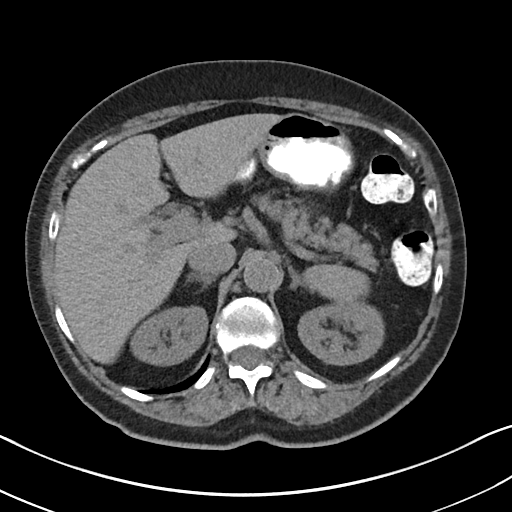
[im 84/96  soft-tissue]
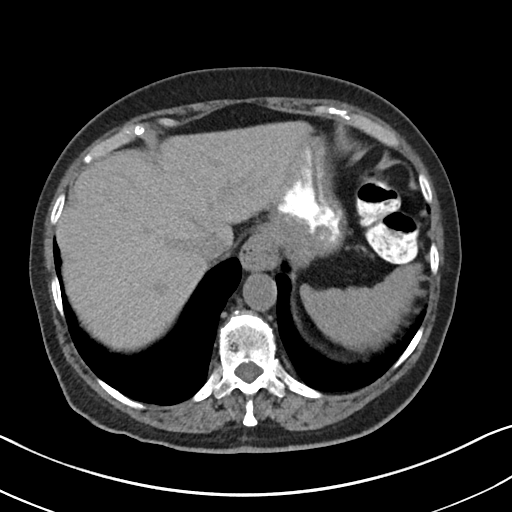
[im 92/96  soft-tissue]
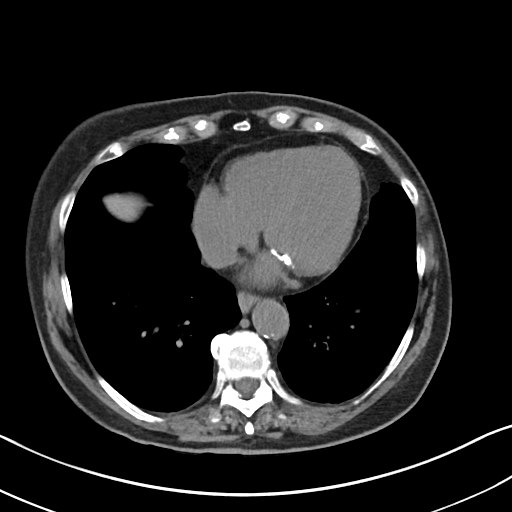

[Series 4: coronals routine abdomen pelvis without 2.00 cor · coronal · non-contrast · 0.70mm/px · 3 of 146 slices shown]
[im 49/146  soft-tissue]
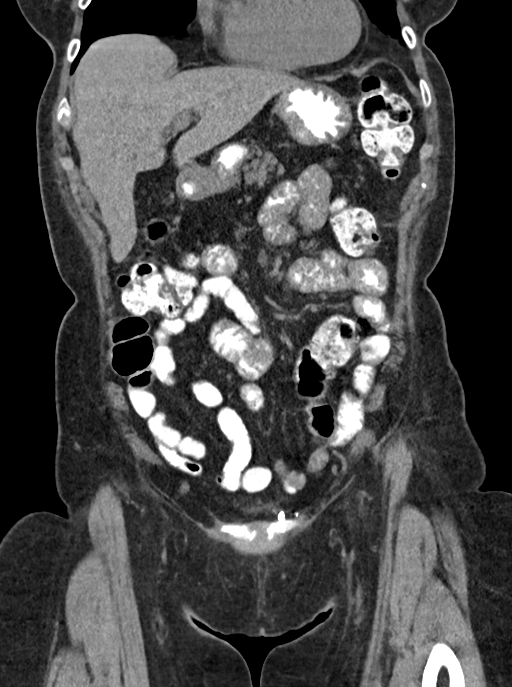
[im 65/146  soft-tissue]
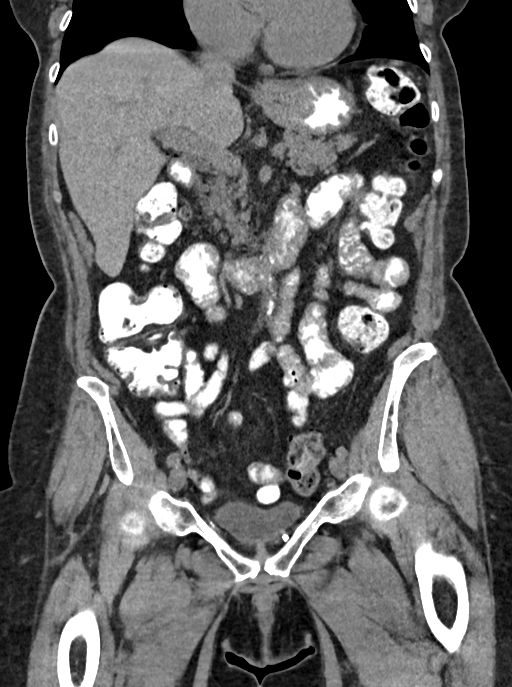
[im 81/146  soft-tissue]
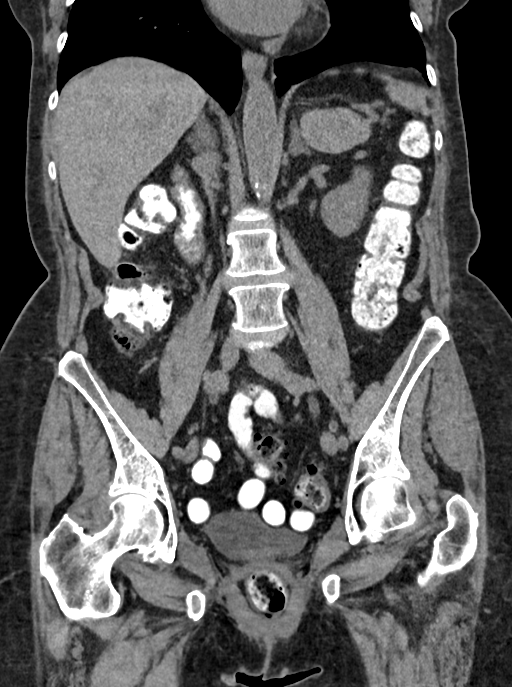

[16 of 46 positions shown; findings below may reference images not displayed]

FINDINGS: Evaluation of this exam is limited in the absence of intravenous
contrast.

Lower chest: The visualized lung bases are clear. There is coronary
vascular calcification and partial calcification of the mitral
annulus.

No intra-abdominal free air or free fluid.

Hepatobiliary: The liver is unremarkable. No intrahepatic biliary
dilatation. Cholecystectomy. No retained calcified stone noted in
the central CBD.

Pancreas: Unremarkable. No pancreatic ductal dilatation or
surrounding inflammatory changes.

Spleen: Normal in size without focal abnormality.

Adrenals/Urinary Tract: The adrenal glands unremarkable. There is no
hydronephrosis or nephrolithiasis on either side. The visualized
ureters and urinary bladder appear unremarkable.

Stomach/Bowel: There is sigmoid diverticulosis without active
inflammatory changes. There is no bowel obstruction or active
inflammation. The appendix is normal.

Vascular/Lymphatic: Moderate aortoiliac atherosclerotic disease. The
IVC is unremarkable. No portal venous gas. There is no adenopathy.

Reproductive: Hysterectomy. No adnexal masses.

Other: Anterior pelvic hernia repair.

Musculoskeletal: Osteopenia with degenerative changes of the spine.
No acute osseous pathology.
IMPRESSION: 1. No acute intra-abdominal or pelvic pathology.
2. Sigmoid diverticulosis. No bowel obstruction. Normal appendix.
3. Aortic Atherosclerosis ([id number]).

## 2021-11-14 ENCOUNTER — Other Ambulatory Visit: Payer: Self-pay | Admitting: Internal Medicine

## 2021-11-14 DIAGNOSIS — R42 Dizziness and giddiness: Secondary | ICD-10-CM

## 2021-11-14 DIAGNOSIS — R55 Syncope and collapse: Secondary | ICD-10-CM

## 2021-11-21 ENCOUNTER — Ambulatory Visit
Admission: RE | Admit: 2021-11-21 | Discharge: 2021-11-21 | Disposition: A | Discharge status: Home / Self Care | Payer: Medicare Other | Source: Ambulatory Visit | Attending: Internal Medicine | Admitting: Internal Medicine

## 2021-11-21 DIAGNOSIS — R55 Syncope and collapse: Secondary | ICD-10-CM

## 2021-11-21 DIAGNOSIS — R42 Dizziness and giddiness: Secondary | ICD-10-CM

## 2021-11-24 ENCOUNTER — Other Ambulatory Visit: Payer: Self-pay | Admitting: Nurse Practitioner

## 2021-11-24 DIAGNOSIS — R110 Nausea: Secondary | ICD-10-CM

## 2021-12-05 ENCOUNTER — Ambulatory Visit: Payer: Medicare Other

## 2021-12-05 ENCOUNTER — Ambulatory Visit
Admission: RE | Admit: 2021-12-05 | Discharge: 2021-12-05 | Disposition: A | Discharge status: Home / Self Care | Payer: Medicare Other | Source: Ambulatory Visit | Attending: Nurse Practitioner | Admitting: Nurse Practitioner

## 2021-12-05 DIAGNOSIS — R110 Nausea: Secondary | ICD-10-CM | POA: Diagnosis present

## 2021-12-05 MED ORDER — TECHNETIUM TC 99M SULFUR COLLOID
2.0000 | Freq: Once | INTRAVENOUS | Status: AC
  Administered 2021-12-05: 1.99 via ORAL

## 2021-12-09 ENCOUNTER — Other Ambulatory Visit: Payer: Medicare Other

## 2022-09-17 ENCOUNTER — Other Ambulatory Visit: Payer: Self-pay | Admitting: Internal Medicine

## 2022-09-17 DIAGNOSIS — R079 Chest pain, unspecified: Secondary | ICD-10-CM

## 2022-10-13 ENCOUNTER — Encounter: Payer: Self-pay | Admitting: Emergency Medicine

## 2022-10-13 ENCOUNTER — Other Ambulatory Visit: Payer: Self-pay

## 2022-10-13 ENCOUNTER — Emergency Department
Admission: EM | Admit: 2022-10-13 | Discharge: 2022-10-13 | Discharge status: Against Medical Advice | Payer: Medicare Other | Attending: Emergency Medicine | Admitting: Emergency Medicine

## 2022-10-13 DIAGNOSIS — Z5321 Procedure and treatment not carried out due to patient leaving prior to being seen by health care provider: Secondary | ICD-10-CM | POA: Diagnosis not present

## 2022-10-13 DIAGNOSIS — I10 Essential (primary) hypertension: Secondary | ICD-10-CM | POA: Insufficient documentation

## 2022-10-13 DIAGNOSIS — R42 Dizziness and giddiness: Secondary | ICD-10-CM | POA: Insufficient documentation

## 2022-10-13 LAB — CBC
HCT: 40.6 % (ref 36.0–46.0)
Hemoglobin: 13.5 g/dL (ref 12.0–15.0)
MCH: 30.1 pg (ref 26.0–34.0)
MCHC: 33.3 g/dL (ref 30.0–36.0)
MCV: 90.4 fL (ref 80.0–100.0)
Platelets: 259 10*3/uL (ref 150–400)
RBC: 4.49 MIL/uL (ref 3.87–5.11)
RDW: 13.1 % (ref 11.5–15.5)
WBC: 5.3 10*3/uL (ref 4.0–10.5)
nRBC: 0 % (ref 0.0–0.2)

## 2022-10-13 LAB — BASIC METABOLIC PANEL
Anion gap: 8 (ref 5–15)
BUN: 19 mg/dL (ref 8–23)
CO2: 24 mmol/L (ref 22–32)
Calcium: 8.6 mg/dL — ABNORMAL LOW (ref 8.9–10.3)
Chloride: 106 mmol/L (ref 98–111)
Creatinine, Ser: 1.17 mg/dL — ABNORMAL HIGH (ref 0.44–1.00)
GFR, Estimated: 45 mL/min — ABNORMAL LOW (ref >60)
Glucose, Bld: 101 mg/dL — ABNORMAL HIGH (ref 70–99)
Potassium: 4.2 mmol/L (ref 3.5–5.1)
Sodium: 138 mmol/L (ref 135–145)

## 2022-10-13 LAB — TROPONIN I (HIGH SENSITIVITY): Troponin I (High Sensitivity): 7 ng/L (ref <18)

## 2022-10-13 NOTE — ED Triage Notes (Signed)
Pt presents ambulatory to triage via POV with complaints of HTN with associated dizziness x 1 day. Pt has a known Hx of HTN and is compliant with medications. States her BP was 213 systolic at home. A&Ox4 at this time. Denies CP or SOB.

## 2022-10-14 ENCOUNTER — Other Ambulatory Visit (HOSPITAL_COMMUNITY): Payer: Self-pay | Admitting: *Deleted

## 2022-10-14 ENCOUNTER — Encounter (HOSPITAL_COMMUNITY): Payer: Self-pay

## 2022-10-14 ENCOUNTER — Telehealth (HOSPITAL_COMMUNITY): Payer: Self-pay | Admitting: *Deleted

## 2022-10-14 ENCOUNTER — Telehealth (HOSPITAL_COMMUNITY): Payer: Self-pay | Admitting: Emergency Medicine

## 2022-10-14 MED ORDER — METOPROLOL TARTRATE 100 MG PO TABS: ORAL_TABLET | ORAL | 0 refills | Status: DC

## 2022-10-14 NOTE — Telephone Encounter (Signed)
Reaching out to patient to offer assistance regarding upcoming cardiac imaging study; pt verbalizes understanding of appt date/time, parking situation and where to check in, pre-test NPO status and medications ordered, and verified current allergies; name and call back number provided for further questions should they arise Mckala Pantaleon RN Navigator Cardiac Imaging [facility] [phone] office [phone] cell 

## 2022-10-14 NOTE — Telephone Encounter (Signed)
Attempted to call patient regarding upcoming cardiac CT appointment. °Left message on voicemail with name and callback number ° °Uel Davidow RN Navigator Cardiac Imaging °[facility] °[phone] Office °[phone] Cell ° °

## 2022-10-15 ENCOUNTER — Ambulatory Visit
Admission: RE | Admit: 2022-10-15 | Discharge: 2022-10-15 | Disposition: A | Discharge status: Home / Self Care | Payer: Medicare Other | Source: Ambulatory Visit | Attending: Internal Medicine | Admitting: Internal Medicine

## 2022-10-15 DIAGNOSIS — I2510 Atherosclerotic heart disease of native coronary artery without angina pectoris: Secondary | ICD-10-CM | POA: Diagnosis not present

## 2022-10-15 DIAGNOSIS — I25118 Atherosclerotic heart disease of native coronary artery with other forms of angina pectoris: Secondary | ICD-10-CM | POA: Insufficient documentation

## 2022-10-15 DIAGNOSIS — R079 Chest pain, unspecified: Secondary | ICD-10-CM | POA: Diagnosis present

## 2022-10-15 MED ORDER — NITROGLYCERIN 0.4 MG SL SUBL: 0.8000 mg | SUBLINGUAL_TABLET | Freq: Once | SUBLINGUAL | Status: DC

## 2022-10-15 MED ORDER — SODIUM CHLORIDE 0.9 % IV SOLN: INTRAVENOUS | Status: DC

## 2022-10-15 MED ORDER — NITROGLYCERIN 0.4 MG SL SUBL
0.4000 mg | SUBLINGUAL_TABLET | Freq: Once | SUBLINGUAL | Status: AC
  Administered 2022-10-15: 0.4 mg via SUBLINGUAL

## 2022-10-15 MED ORDER — IOHEXOL 350 MG/ML SOLN
75.0000 mL | Freq: Once | INTRAVENOUS | Status: AC | PRN
  Administered 2022-10-15: 75 mL via INTRAVENOUS

## 2022-10-15 NOTE — Progress Notes (Signed)
Patient tolerated procedure well. Ambulate w/o difficulty. Pt states that she has some lightheadedness but states that has been going on a lot lately and is one of the reasons she is having these tests. Pt denies any pain at this time. Sitting in chair, drinking water provided. Pt is encouraged to drink additional water throughout the day and reason explained to patient. Patient verbalized understanding and all questions answered. ABC intact. No further needs at this time. Discharge from procedure area w/o issues.

## 2022-10-30 ENCOUNTER — Ambulatory Visit
Admission: RE | Admit: 2022-10-30 | Discharge: 2022-10-30 | Disposition: A | Discharge status: Home / Self Care | Payer: Medicare Other | Source: Ambulatory Visit | Attending: Unknown Physician Specialty | Admitting: Unknown Physician Specialty

## 2022-10-30 ENCOUNTER — Other Ambulatory Visit: Payer: Self-pay | Admitting: Unknown Physician Specialty

## 2022-10-30 DIAGNOSIS — R519 Headache, unspecified: Secondary | ICD-10-CM

## 2022-12-31 ENCOUNTER — Other Ambulatory Visit: Payer: Self-pay | Admitting: Internal Medicine

## 2022-12-31 DIAGNOSIS — Z1231 Encounter for screening mammogram for malignant neoplasm of breast: Secondary | ICD-10-CM

## 2023-01-20 ENCOUNTER — Encounter: Payer: Self-pay | Admitting: Cardiovascular Disease

## 2023-01-20 ENCOUNTER — Ambulatory Visit: Payer: Medicare Other | Attending: Cardiovascular Disease | Admitting: Cardiovascular Disease

## 2023-01-20 VITALS — BP 126/86 | HR 65 | Ht 65.5 in | Wt 171.6 lb

## 2023-01-20 DIAGNOSIS — K219 Gastro-esophageal reflux disease without esophagitis: Secondary | ICD-10-CM

## 2023-01-20 DIAGNOSIS — I2510 Atherosclerotic heart disease of native coronary artery without angina pectoris: Secondary | ICD-10-CM

## 2023-01-20 DIAGNOSIS — I10 Essential (primary) hypertension: Secondary | ICD-10-CM

## 2023-01-20 DIAGNOSIS — E7800 Pure hypercholesterolemia, unspecified: Secondary | ICD-10-CM

## 2023-01-20 DIAGNOSIS — R0789 Other chest pain: Secondary | ICD-10-CM

## 2023-01-20 NOTE — Progress Notes (Signed)
Cardiology Office Note    Date:  01/25/2023   ID:  Darlene, Gibson [birth year], MRN [id number]  PCP:  Marguarite Arbour, MD  Cardiologist:  Nicki Guadalajara, MD   New cardiology evaluation referred by Dr. Aram Beecham   History of Present Illness:  [place] is a 89 y.o. female who is followed by Dr. Aram Beecham in [place].  She has a history of hypertension, hyperlipidemia not on statin therapy, vitamin D deficiency as well as dyspepsia.  She recently has noticed some vague chest discomfort which she describes as tightness.  Oftentimes she does experience some nausea.  Her chest pain is better with drinking water.  She admits that she eats "a lot of bread." She had undergone a coronary calcium score on October 15, 2022 at [facility] outpatient imaging center.  Coronary calcium was 326 Agatston units.  She was felt to have minimal less than 25% plaque in the proximal left main, and mild proximal LAD stenosis of 25%. No plaque was detected in the circumflex or RCA.  Most recently, she has been on amlodipine 10 mg but had stopped taking this.  She is only now been taking losartan HCT 100/25 mg.  She admits to significant burping and passing a lot of gas and was recently started on omeprazole.  She is scheduled to have cochlear implants in early 2025.  She has a history of hyperlipidemia.  Recent laboratory in December 2023 showed total cholesterol 219, triglycerides 80, HDL 87 and LDL 116.  She is not on any lipid-lowering therapy.  Because of her episodes of chest tightness she now presents for evaluation.    Past Medical History:  Diagnosis Date   Arthritis    Hyperlipidemia    Hypertension     Past Surgical History:  Procedure Laterality Date   BLADDER SUSPENSION      Current Medications: Outpatient Medications Prior to Visit  Medication Sig Dispense Refill   acetaminophen (TYLENOL) 325 MG tablet Take 325 mg by mouth every 6 (six) hours as needed.     aspirin EC 81  MG tablet Take 81 mg by mouth daily.     cholestyramine (QUESTRAN) 4 GM/DOSE powder SMARTSIG:4 Gram(s) By Mouth Daily     cyanocobalamin (,VITAMIN B-12,) 1000 MCG/ML injection Inject 1,000 mcg into the muscle every 30 (thirty) days.     losartan-hydrochlorothiazide (HYZAAR) 100-12.5 MG tablet Take 1 tablet by mouth daily.     omeprazole (PRILOSEC) 20 MG capsule Take 20 mg by mouth daily.     amLODipine (NORVASC) 10 MG tablet Take 10 mg by mouth daily.     Cholecalciferol 25 MCG (1000 UT) tablet Take by mouth.     ciprofloxacin (CIPRO) 500 MG tablet Take 500 mg by mouth 2 (two) times daily.     famotidine (PEPCID) 40 MG tablet Take 40 mg by mouth at bedtime.     imipramine (TOFRANIL) 25 MG tablet Take by mouth.     metoprolol tartrate (LOPRESSOR) 100 MG tablet Take tablet (100mg ) TWO hours prior to your cardiac CT scan. 1 tablet 0   neomycin-polymyxin-hydrocortisone (CORTISPORIN) 3.5-10000-1 OTIC suspension SMARTSIG:In Ear(s)     PREMARIN vaginal cream Place vaginally.     tolterodine (DETROL LA) 4 MG 24 hr capsule Take by mouth.     No facility-administered medications prior to visit.     Allergies:   Penicillins, Nitrofurantoin, Pravastatin, Prednisone, and Other   Social History   Socioeconomic History  Marital status: Widowed    Spouse name: Not on file   Number of children: Not on file   Years of education: Not on file   Highest education level: Not on file  Occupational History   Not on file  Tobacco Use   Smoking status: Never   Smokeless tobacco: Never  Substance and Sexual Activity   Alcohol use: No   Drug use: No   Sexual activity: Not on file  Other Topics Concern   Not on file  Social History Narrative   Not on file   Social Determinants of Health   Financial Resource Strain: Not on file  Food Insecurity: Not on file  Transportation Needs: Not on file  Physical Activity: Not on file  Stress: Not on file  Social Connections: Not on file    Socially she  was born in [place]  [place] in [place].  She lives by herself and is widowed for 25 years.  She has 3 children, 8 grandchildren, and 14 great-grandchildren.  Previously she worked at [employer]  She completed high school.  There is no tobacco history.  She drinks approximately 2 glasses of wine per week.  She does not exercise.   Family History:  The patient's family history includes Breast cancer (age of onset: 24) in her mother.  Both parents are deceased, mother died at age 57 and had a history of breast cancer remotely.  Father died at age 23.  She had 2 brothers and 3 sisters, all deceased without heart issues.  1 son age 69 died of a myocardial infarction at age 37.  Her 2 daughters age 106 and 84 are stable with Junious Dresser age 72 with melanoma.   ROS General: Negative; No fevers, chills, or night sweats;  HEENT: Decreased hearing may need cochlear implants, nosinus congestion, difficulty swallowing Pulmonary: Negative; No cough, wheezing, shortness of breath, hemoptysis Cardiovascular: Chest wall tenderness GI: GERD; frequent burping GU: Negative; No dysuria, hematuria, or difficulty voiding Musculoskeletal: Negative; no myalgias, joint pain, or weakness Hematologic/Oncology: Negative; no easy bruising, bleeding Endocrine: Negative; no heat/cold intolerance; no diabetes Neuro: Negative; no changes in balance, headaches Skin: Negative; No rashes or skin lesions Psychiatric: Negative; No behavioral problems, depression Sleep: Negative; No snoring, daytime sleepiness, hypersomnolence, bruxism, restless legs, hypnogognic hallucinations, no cataplexy Other comprehensive 14 point system review is negative.   PHYSICAL EXAM:   VS:  BP 126/86   Pulse 65   Ht 5' 5.5" (1.664 m)   Wt 171 lb 9.6 oz (77.8 kg)   SpO2 95%   BMI 28.12 kg/m     Repeat blood pressure by me was 140/70  Wt Readings from Last 3 Encounters:  01/20/23 171 lb 9.6 oz (77.8 kg)  10/13/22 170 lb (77.1  kg)  04/29/21 170 lb (77.1 kg)    General: Alert, oriented, no distress.  Skin: normal turgor, no rashes, warm and dry HEENT: Normocephalic, atraumatic. Pupils equal round and reactive to light; sclera anicteric; extraocular muscles intact;  Nose without nasal septal hypertrophy Mouth/Parynx benign; Mallinpatti scale 3 Neck: No JVD, no carotid bruits; normal carotid upstroke Lungs: clear to ausculatation and percussion; no wheezing or rales Chest wall: without tenderness to palpitation Heart: PMI not displaced, RRR, s1 s2 normal, 1/6 systolic murmur, no diastolic murmur, no rubs, gallops, thrills, or heaves Abdomen: soft, nontender; no hepatosplenomehaly, BS+; abdominal aorta nontender and not dilated by palpation. Back: no CVA tenderness Pulses 2+ Musculoskeletal: full range of motion, normal strength, no  joint deformities Extremities: no clubbing cyanosis or edema, Homan's sign negative  Neurologic: grossly nonfocal; Cranial nerves grossly wnl Psychologic: Normal mood and affect   Studies/Labs Reviewed:   EKG Interpretation Date/Time:  Wednesday January 20 2023 15:58:28 EDT Ventricular Rate:  65 PR Interval:  154 QRS Duration:  76 QT Interval:  384 QTC Calculation: 399 R Axis:   70  Text Interpretation: Normal sinus rhythm Nonspecific ST abnormality When compared with ECG of 13-Oct-2022 21:07, No significant change was found Confirmed by Nicki Guadalajara ([id number]) on 01/25/2023 2:35:28 PM    Recent Labs:    Latest Ref Rng & Units 10/13/2022    9:06 PM 01/07/2019    8:17 PM 05/28/2017    7:13 PM  BMP  Glucose 70 - 99 mg/dL 604  540  981   BUN 8 - 23 mg/dL 19  15  14    Creatinine 0.44 - 1.00 mg/dL 1.91  4.78  2.95   Sodium 135 - 145 mmol/L 138  141  137   Potassium 3.5 - 5.1 mmol/L 4.2  3.8  3.5   Chloride 98 - 111 mmol/L 106  107  103   CO2 22 - 32 mmol/L 24  28  24    Calcium 8.9 - 10.3 mg/dL 8.6  8.9  8.9         Latest Ref Rng & Units 05/28/2017    7:13 PM  Hepatic  Function  Total Protein 6.5 - 8.1 g/dL 6.6   Albumin 3.5 - 5.0 g/dL 3.8   AST 15 - 41 U/L 94   ALT 14 - 54 U/L 86   Alk Phosphatase 38 - 126 U/L 62   Total Bilirubin 0.3 - 1.2 mg/dL 0.9        Latest Ref Rng & Units 10/13/2022    9:06 PM 01/07/2019    8:17 PM 05/28/2017    7:13 PM  CBC  WBC 4.0 - 10.5 K/uL 5.3  5.5  13.9   Hemoglobin 12.0 - 15.0 g/dL 62.1  30.8  65.7   Hematocrit 36.0 - 46.0 % 40.6  39.9  41.6   Platelets 150 - 400 K/uL 259  224  200    Lab Results  Component Value Date   MCV 90.4 10/13/2022   MCV 90.5 01/07/2019   MCV 88.2 05/28/2017   No results found for: "TSH" No results found for: "HGBA1C"   BNP No results found for: "BNP"  ProBNP No results found for: "PROBNP"   Lipid Panel  No results found for: "CHOL", "TRIG", "HDL", "CHOLHDL", "VLDL", "LDLCALC", "LDLDIRECT", "LABVLDL"   RADIOLOGY: No results found.   Additional studies/ records that were reviewed today include:  Records from [place] [facility] as well as the [facility] were reviewed.   ASSESSMENT:    1. Primary hypertension   2. Coronary artery calcification seen on CAT scan: 326 Agatston units   3. Chest tightness   4. Chest wall tenderness   5. Gastroesophageal reflux disease, unspecified whether esophagitis present   6. Pure hypercholesterolemia      PLAN:  Ms. Judithe Keetch is an 89 year old widowed female who has a history of hypertension, hyperlipidemia not on therapy, vitamin D deficiency on oral supplements as well as B12 deficiency on injections.  She has experienced episodes of occasional chest tightness which are not exertionally precipitated.  She had undergone a coronary calcium score which showed coronary calcification at 326 Agatston units with minimal less than 25% left main and mild proximal LAD stenosis of  25%.  She had a normal circumflex and RCA.  Chest x-ray over read showed mild degenerative changes in the thoracic spine.  There also were some areas of dependent  atelectasis in her lungs.  Her blood pressure today was stable.  She apparently had been on amlodipine but stopped taking this and is on losartan HCT 100/12.5 mg.  She has experienced some occasional chest tightness which is not exertional.  On exam she has bilateral costochondral tenderness which seems to mimic her chest pain.  She admits to increased GERD and burping passing a lot of gas which has improved slightly on omeprazole.  Her ECG today shows normal sinus rhythm with nonspecific ST abnormality.  Presently, I am recommending she undergo a 2D echo Doppler study to evaluate systolic and diastolic function.  I am recommending she undergo a noninvasive evaluation with PET nuclear imaging to make certain she is not having underlying ischemia.  I also have suggested possibility of adding low-dose rosuvastatin but I will not do this presently she will be undergoing repeat laboratory with Dr. Judithann Sheen in the near future.   Medication Adjustments/Labs and Tests Ordered: Current medicines are reviewed at length with the patient today.  Concerns regarding medicines are outlined above.  Medication changes, Labs and Tests ordered today are listed in the Patient Instructions below. Patient Instructions  Medication Instructions:  TAKE OVER -THE- COUNTER TYLENOL FOR CHEST TIGHTNESS.  *If you need a refill on your cardiac medications before your next appointment, please call your pharmacy*   Lab Work: NO LABS ORDERS TODAY If you have labs (blood work) drawn today and your tests are completely normal, you will receive your results only by: MyChart Message (if you have MyChart) OR A paper copy in the mail If you have any lab test that is abnormal or we need to change your treatment, we will call you to review the results.     Testing/Procedures: Your physician has requested that you have an echocardiogram. Echocardiography is a painless test that uses sound waves to create images of your heart. It  provides your doctor with information about the size and shape of your heart and how well your heart's chambers and valves are working. This procedure takes approximately one hour. There are no restrictions for this procedure. Please do NOT wear cologne, perfume, aftershave, or lotions (deodorant is allowed).  Please arrive 15 minutes prior to your appointment time.      CARDIAC PET- Your physician has requested that you have a Cardiac Pet Stress Test.   This testing is completed at [facility] ([address] [place], [place] [address]) or [facility] ([address], [place], [place]). Please arrive 30 minutes prior to your scheduled time.  The schedulers will call you to get this scheduled. Please follow further testing instructions below.       Follow-Up: At [facility], you and your health needs are our priority.  As part of our continuing mission to provide you with exceptional heart care, we have created designated Provider Care Teams.  These Care Teams include your primary Cardiologist (physician) and Advanced Practice Providers (APPs -  Physician Assistants and Nurse Practitioners) who all work together to provide you with the care you need, when you need it.  We recommend signing up for the patient portal called "MyChart".  Sign up information is provided on this After Visit Summary.  MyChart is used to connect with patients for Virtual Visits (Telemedicine).  Patients  are able to view lab/test results, encounter notes, upcoming appointments, etc.  Non-urgent messages can be sent to your provider as well.   To learn more about what you can do with MyChart, go to [email or web address].    Your next appointment:   3 month(s)  Provider:   DR. Nicki Guadalajara {If Card or EP not listed click to update      Signed, Nicki Guadalajara, MD  01/25/2023 2:43 PM    [facility] [address],  [place], [place]  [address] Phone: [phone]

## 2023-01-20 NOTE — Patient Instructions (Signed)
Medication Instructions:  TAKE OVER -THE- COUNTER TYLENOL FOR CHEST TIGHTNESS.  *If you need a refill on your cardiac medications before your next appointment, please call your pharmacy*   Lab Work: NO LABS ORDERS TODAY If you have labs (blood work) drawn today and your tests are completely normal, you will receive your results only by: MyChart Message (if you have MyChart) OR A paper copy in the mail If you have any lab test that is abnormal or we need to change your treatment, we will call you to review the results.     Testing/Procedures: Your physician has requested that you have an echocardiogram. Echocardiography is a painless test that uses sound waves to create images of your heart. It provides your doctor with information about the size and shape of your heart and how well your heart's chambers and valves are working. This procedure takes approximately one hour. There are no restrictions for this procedure. Please do NOT wear cologne, perfume, aftershave, or lotions (deodorant is allowed).  Please arrive 15 minutes prior to your appointment time.      CARDIAC PET- Your physician has requested that you have a Cardiac Pet Stress Test.   This testing is completed at [facility] ([address] [place], [place] [address]) or [facility] ([address] [place], [place]). Please arrive 30 minutes prior to your scheduled time.  The schedulers will call you to get this scheduled. Please follow further testing instructions below.       Follow-Up: At [facility], you and your health needs are our priority.  As part of our continuing mission to provide you with exceptional heart care, we have created designated Provider Care Teams.  These Care Teams include your primary Cardiologist (physician) and Advanced Practice Providers (APPs -  Physician Assistants and Nurse Practitioners) who all work together to provide you with  the care you need, when you need it.  We recommend signing up for the patient portal called "MyChart".  Sign up information is provided on this After Visit Summary.  MyChart is used to connect with patients for Virtual Visits (Telemedicine).  Patients are able to view lab/test results, encounter notes, upcoming appointments, etc.  Non-urgent messages can be sent to your provider as well.   To learn more about what you can do with MyChart, go to [email or web address].    Your next appointment:   3 month(s)  Provider:   DR. Nicki Guadalajara {If Card or EP not listed click to update

## 2023-01-21 ENCOUNTER — Telehealth: Payer: Self-pay | Admitting: Cardiovascular Disease

## 2023-01-21 NOTE — Telephone Encounter (Signed)
Did not see the medication she is to start.  Advised would send to provider for recommendation and will call her once confirmed.  Did note that the provider is out of office at the moment

## 2023-01-21 NOTE — Telephone Encounter (Signed)
Patient states during her appointment with Dr. Tresa Endo yesterday she was told a cholesterol medication would be called in to [facility], but they have not received a prescription. She also mentions that the new medication is not included in her notes. Please advise.

## 2023-01-25 ENCOUNTER — Encounter: Payer: Self-pay | Admitting: Cardiovascular Disease

## 2023-02-02 ENCOUNTER — Telehealth (HOSPITAL_COMMUNITY): Payer: Self-pay | Admitting: Emergency Medicine

## 2023-02-02 ENCOUNTER — Encounter (HOSPITAL_COMMUNITY): Payer: Self-pay

## 2023-02-02 NOTE — Telephone Encounter (Signed)
Reaching out to patient to offer assistance regarding upcoming cardiac imaging study; pt verbalizes understanding of appt date/time, parking situation and where to check in, pre-test NPO status and medications ordered, and verified current allergies; name and call back number provided for further questions should they arise Cayne Yom RN Navigator Cardiac Imaging [facility] [phone] office [phone] cell 

## 2023-02-02 NOTE — Telephone Encounter (Signed)
Per Kelly's note (C&P) "I also have suggested possibility of adding low-dose rosuvastatin but I will not do this presently she will be undergoing repeat laboratory with Dr. Judithann Sheen in the near future."

## 2023-02-04 ENCOUNTER — Ambulatory Visit
Admission: RE | Admit: 2023-02-04 | Discharge: 2023-02-04 | Disposition: A | Discharge status: Home / Self Care | Payer: Medicare Other | Source: Ambulatory Visit | Attending: Cardiovascular Disease | Admitting: Cardiovascular Disease

## 2023-02-04 DIAGNOSIS — R0789 Other chest pain: Secondary | ICD-10-CM | POA: Insufficient documentation

## 2023-02-04 DIAGNOSIS — I700 Atherosclerosis of aorta: Secondary | ICD-10-CM | POA: Diagnosis not present

## 2023-02-04 MED ORDER — RUBIDIUM RB82 GENERATOR (RUBYFILL)
25.0000 | PACK | Freq: Once | INTRAVENOUS | Status: AC
  Administered 2023-02-04: 20.09 via INTRAVENOUS

## 2023-02-04 MED ORDER — RUBIDIUM RB82 GENERATOR (RUBYFILL)
25.0000 | PACK | Freq: Once | INTRAVENOUS | Status: AC
  Administered 2023-02-04: 20.11 via INTRAVENOUS

## 2023-02-04 MED ORDER — REGADENOSON 0.4 MG/5ML IV SOLN
0.4000 mg | Freq: Once | INTRAVENOUS | Status: AC
  Administered 2023-02-04: 0.4 mg via INTRAVENOUS
  Filled 2023-02-04: qty 5

## 2023-02-04 MED ORDER — REGADENOSON 0.4 MG/5ML IV SOLN
INTRAVENOUS | Status: AC
  Filled 2023-02-04: qty 5

## 2023-02-04 NOTE — Progress Notes (Signed)
Pt tolerated exam without incident; vital signs within normal limits; pt denies lightheadedness or dizziness; encouraged to drink caffeine, eat meal; pt ambulatory to lobby steady gait noted

## 2023-02-08 LAB — NM PET CT CARDIAC PERFUSION MULTI W/ABSOLUTE BLOODFLOW
LV dias vol: 53 mL (ref 46–106)
LV sys vol: 15 mL
MBFR: 2.19
Nuc Rest EF: 72 %
Nuc Stress EF: 73 %
Peak HR: 91 {beats}/min
Rest HR: 65 {beats}/min
Rest MBF: 0.99 ml/g/min
Rest Nuclear Isotope Dose: 20.1 mCi
SRS: 0
SSS: 0
ST Depression (mm): 0 mm
Stress MBF: 2.17 ml/g/min
Stress Nuclear Isotope Dose: 20.1 mCi

## 2023-02-08 NOTE — Progress Notes (Signed)
Patient called. Patient aware of PET results

## 2023-02-09 ENCOUNTER — Ambulatory Visit: Payer: Medicare Other | Attending: Cardiovascular Disease

## 2023-02-09 DIAGNOSIS — R0789 Other chest pain: Secondary | ICD-10-CM | POA: Diagnosis not present

## 2023-02-10 LAB — ECHOCARDIOGRAM COMPLETE
AR max vel: 2.8 cm2
AV Area VTI: 2.88 cm2
AV Area mean vel: 2.81 cm2
AV Mean grad: 7 mm[Hg]
AV Peak grad: 12.3 mm[Hg]
Ao pk vel: 1.75 m/s
Area-P 1/2: 1.91 cm2
Calc EF: 53.4 %
S' Lateral: 2.2 cm
Single Plane A2C EF: 55.5 %
Single Plane A4C EF: 54.2 %

## 2023-03-09 ENCOUNTER — Ambulatory Visit
Admission: RE | Admit: 2023-03-09 | Discharge: 2023-03-09 | Disposition: A | Discharge status: Home / Self Care | Payer: Medicare Other | Source: Ambulatory Visit | Attending: Internal Medicine | Admitting: Internal Medicine

## 2023-03-09 DIAGNOSIS — Z1231 Encounter for screening mammogram for malignant neoplasm of breast: Secondary | ICD-10-CM | POA: Insufficient documentation

## 2023-04-06 ENCOUNTER — Encounter: Payer: Self-pay | Admitting: Cardiovascular Disease

## 2023-04-06 ENCOUNTER — Telehealth: Payer: Self-pay | Admitting: Cardiovascular Disease

## 2023-04-06 ENCOUNTER — Ambulatory Visit: Payer: Medicare Other | Attending: Cardiovascular Disease | Admitting: Cardiovascular Disease

## 2023-04-06 VITALS — BP 114/78 | HR 62 | Ht 66.0 in | Wt 172.8 lb

## 2023-04-06 DIAGNOSIS — I10 Essential (primary) hypertension: Secondary | ICD-10-CM

## 2023-04-06 DIAGNOSIS — I421 Obstructive hypertrophic cardiomyopathy: Secondary | ICD-10-CM

## 2023-04-06 DIAGNOSIS — E7800 Pure hypercholesterolemia, unspecified: Secondary | ICD-10-CM

## 2023-04-06 DIAGNOSIS — K219 Gastro-esophageal reflux disease without esophagitis: Secondary | ICD-10-CM

## 2023-04-06 DIAGNOSIS — I4892 Unspecified atrial flutter: Secondary | ICD-10-CM

## 2023-04-06 DIAGNOSIS — I2510 Atherosclerotic heart disease of native coronary artery without angina pectoris: Secondary | ICD-10-CM | POA: Diagnosis not present

## 2023-04-06 DIAGNOSIS — R0789 Other chest pain: Secondary | ICD-10-CM

## 2023-04-06 MED ORDER — ROSUVASTATIN CALCIUM 20 MG PO TABS: 20.0000 mg | ORAL_TABLET | Freq: Every day | ORAL | 3 refills | Status: AC

## 2023-04-06 MED ORDER — LOSARTAN POTASSIUM 50 MG PO TABS: 50.0000 mg | ORAL_TABLET | Freq: Two times a day (BID) | ORAL | 3 refills | Status: DC

## 2023-04-06 NOTE — Patient Instructions (Addendum)
Medication Instructions:  Begin Rosuvastatin 20mg . Take one tablet daily.  *If you need a refill on your cardiac medications before your next appointment, please call your pharmacy*   Lab Work: Return for fasting labs If you have labs (blood work) drawn today and your tests are completely normal, you will receive your results only by: MyChart Message (if you have MyChart) OR A paper copy in the mail If you have any lab test that is abnormal or we need to change your treatment, we will call you to review the results.   Follow-Up: At [facility], you and your health needs are our priority.  As part of our continuing mission to provide you with exceptional heart care, we have created designated Provider Care Teams.  These Care Teams include your primary Cardiologist (physician) and Advanced Practice Providers (APPs -  Physician Assistants and Nurse Practitioners) who all work together to provide you with the care you need, when you need it.  We recommend signing up for the patient portal called "MyChart".  Sign up information is provided on this After Visit Summary.  MyChart is used to connect with patients for Virtual Visits (Telemedicine).  Patients are able to view lab/test results, encounter notes, upcoming appointments, etc.  Non-urgent messages can be sent to your provider as well.   To learn more about what you can do with MyChart, go to [email or web address].    Your next appointment:   6 month(s)  Provider:   Julien Nordmann, MD

## 2023-04-06 NOTE — Telephone Encounter (Signed)
Pt c/o medication issue:  1. Name of Medication:   omeprazole (PRILOSEC) 20 MG capsule    2. How are you currently taking this medication (dosage and times per day)?   Take 20 mg by mouth daily.    3. Are you having a reaction (difficulty breathing--STAT)? No  4. What is your medication issue? Pt states that the above medication looks different than the prescription she has got in the past. Please advise

## 2023-04-06 NOTE — Progress Notes (Signed)
Cardiology Office Note    Date:  04/06/2023   ID:  London, Gabhart 1932/06/20, MRN [id number]  PCP:  Marguarite Arbour, MD  Cardiologist:  Nicki Guadalajara, MD   2 month F/U cardiology evaluation referred by Dr. Aram Beecham   History of Present Illness:  Darlene Gibson is a 89 y.o. female who is followed by Dr. Aram Beecham in [place].  She has a history of hypertension, hyperlipidemia not on statin therapy, vitamin D deficiency as well as dyspepsia.  She recently has noticed some vague chest discomfort which she describes as tightness.  Oftentimes she does experience some nausea.  Her chest pain is better with drinking water.  She admits that she eats "a lot of bread." She had undergone a coronary calcium score on October 15, 2022 at [facility] center.  Coronary calcium was 326 Agatston units.  She was felt to have minimal less than 25% plaque in the proximal left main, and mild proximal LAD stenosis of 25%. No plaque was detected in the circumflex or RCA.  Most recently, she has been on amlodipine 10 mg but had stopped taking this.  She is only now been taking losartan HCT 100/25 mg.  She admits to significant burping and passing a lot of gas and was recently started on omeprazole.  She is scheduled to have cochlear implants in early 2025.  She has a history of hyperlipidemia.  Recent laboratory in December 2023 showed total cholesterol 219, triglycerides 80, HDL 87 and LDL 116.  She is not on any lipid-lowering therapy.  Because of her episodes of chest tightness her on January 20, 2023 for cardiology evaluation.  For that evaluation, she was having increased GERD.  Her ECG showed sinus rhythm with nonspecific ST changes.  I recommended she undergo a 2D echo Doppler study as well as noninvasive evaluation with PET nuclear imaging to make certain she was not having underlying ischemia.  I suggested adding possible low-dose rosuvastatin but she was to be seeing Dr. Judithann Sheen  in the near future and this was not initiated.  She underwent a nuclear medicine PET/CT cardiac perfusion study on February 04, 2023.  She was noted to have atherosclerotic calcifications in the thoracic aorta.  LV perfusion was normal without evidence for ischemia or infarction.  Rest EF was 72%, stress EF 73%.  Cardial blood flow was normal with normal blood flow reserve.  Eight 2D echo Doppler study in February 09, 2023 showed EF at 60 to 65% with grade 1 diastolic dysfunction without significant valvular pathology.  However, she had near cavity obliteration in systole with moderate concentric LVH.  LVOT gradient was 28 mm at rest which increased to 54 mm with Valsalva maneuver.  There was grade 1 diastolic dysfunction.  Presently, she feels well.  He had undergone recent laboratory which showed total cholesterol 211 triglycerides 88 HDL 81 and LDL cholesterol 112.  She denies any recent chest pain or palpitations.  He is on losartan HCT 100/12.5 mg daily for hypertension and takes baby aspirin 81 mg.  She is on omeprazole 20 mg daily for GERD.  She presents for evaluation.    Past Medical History:  Diagnosis Date   Arthritis    Hyperlipidemia    Hypertension     Past Surgical History:  Procedure Laterality Date   BLADDER SUSPENSION      Current Medications: Outpatient Medications Prior to Visit  Medication Sig Dispense Refill   acetaminophen (  TYLENOL) 325 MG tablet Take 325 mg by mouth every 6 (six) hours as needed. Pt. Reports she is taking this 3 times a day     aspirin EC 81 MG tablet Take 81 mg by mouth daily.     cholestyramine (QUESTRAN) 4 GM/DOSE powder SMARTSIG:4 Gram(s) By Mouth Daily     cyanocobalamin (,VITAMIN B-12,) 1000 MCG/ML injection Inject 1,000 mcg into the muscle every 30 (thirty) days.     losartan-hydrochlorothiazide (HYZAAR) 100-12.5 MG tablet Take 1 tablet by mouth daily.     omeprazole (PRILOSEC) 20 MG capsule Take 20 mg by mouth daily.     famotidine (PEPCID)  40 MG tablet Take 40 mg by mouth at bedtime.     imipramine (TOFRANIL) 25 MG tablet Take by mouth.     metoprolol tartrate (LOPRESSOR) 100 MG tablet Take tablet (100mg ) TWO hours prior to your cardiac CT scan. 1 tablet 0   No facility-administered medications prior to visit.     Allergies:   Penicillins, Nitrofurantoin, Pravastatin, Prednisone, and Other   Social History   Socioeconomic History   Marital status: Widowed    Spouse name: Not on file   Number of children: Not on file   Years of education: Not on file   Highest education level: Not on file  Occupational History   Not on file  Tobacco Use   Smoking status: Never   Smokeless tobacco: Never  Substance and Sexual Activity   Alcohol use: No   Drug use: No   Sexual activity: Not on file  Other Topics Concern   Not on file  Social History Narrative   Not on file   Social Drivers of Health   Financial Resource Strain: Low Risk  (03/08/2023)   Received from [facility]   Overall Financial Resource Strain (CARDIA)    Difficulty of Paying Living Expenses: Not hard at all  Food Insecurity: No Food Insecurity (03/08/2023)   Received from [facility]   Hunger Vital Sign    Worried About Running Out of Food in the Last Year: Never true    Ran Out of Food in the Last Year: Never true  Transportation Needs: No Transportation Needs (03/08/2023)   Received from [facility] - Transportation    In the past 12 months, has lack of transportation kept you from medical appointments or from getting medications?: No    Lack of Transportation (Non-Medical): No  Physical Activity: Not on file  Stress: Not on file  Social Connections: Not on file    Socially she was born in [place].  She lives in [place].  She lives by herself and is widowed for 25 years.  She has 3 children, 8 grandchildren, and 14 great-grandchildren.  Previously she worked at [employer].  She completed high school.  There is no tobacco history.  She drinks approximately 2 glasses of wine per week.  She does not exercise.   Family History:  The patient's family history includes Breast cancer (age of onset: 23) in her mother.  Both parents are deceased, mother died at age 66 and had a history of breast cancer remotely.  Father died at age 10.  She had 2 brothers and 3 sisters, all deceased without heart issues.  1 son age 25 died of a myocardial infarction at age 78.  Her 2 daughters age 3 and 64 are stable with Junious Dresser age 56 with melanoma.   ROS  General: Negative; No fevers, chills, or night sweats;  HEENT: Decreased hearing may need cochlear implants, nosinus congestion, difficulty swallowing Pulmonary: Negative; No cough, wheezing, shortness of breath, hemoptysis Cardiovascular: Chest wall tenderness GI: GERD; frequent burping GU: Negative; No dysuria, hematuria, or difficulty voiding Musculoskeletal: Negative; no myalgias, joint pain, or weakness Hematologic/Oncology: Negative; no easy bruising, bleeding Endocrine: Negative; no heat/cold intolerance; no diabetes Neuro: Negative; no changes in balance, headaches Skin: Negative; No rashes or skin lesions Psychiatric: Negative; No behavioral problems, depression Sleep: Negative; No snoring, daytime sleepiness, hypersomnolence, bruxism, restless legs, hypnogognic hallucinations, no cataplexy Other comprehensive 14 point system review is negative.   PHYSICAL EXAM:   VS:  BP 114/78   Pulse 62   Ht 5\' 6"  (1.676 m)   Wt 172 lb 12.8 oz (78.4 kg)   SpO2 93%   BMI 27.89 kg/m     Repeat blood pressure by me was 122/68  Wt Readings from Last 3 Encounters:  04/06/23 172 lb 12.8 oz (78.4 kg)  01/20/23 171 lb 9.6 oz (77.8 kg)  10/13/22 170 lb (77.1 kg)    General: Alert, oriented, no distress.  Skin: normal turgor, no rashes, warm and dry HEENT: Normocephalic, atraumatic. Pupils equal round and reactive to light;  sclera anicteric; extraocular muscles intact; Nose without nasal septal hypertrophy Mouth/Parynx benign; Mallinpatti scale 3 Neck: No JVD, no carotid bruits; normal carotid upstroke Lungs: clear to ausculatation and percussion; no wheezing or rales Chest wall: without tenderness to palpitation Heart: PMI not displaced, RRR, s1 s2 normal, 1/6 systolic murmur sternal border which decreases slightly going from standing to squatting position and increases slightly going from squatting to standing position, no diastolic murmur, no rubs, gallops, thrills, or heaves Abdomen: soft, nontender; no hepatosplenomehaly, BS+; abdominal aorta nontender and not dilated by palpation. Back: no CVA tenderness Pulses 2+ Musculoskeletal: full range of motion, normal strength, no joint deformities Extremities: no clubbing cyanosis or edema, Homan's sign negative  Neurologic: grossly nonfocal; Cranial nerves grossly wnl Psychologic: Normal mood and affect     Studies/Labs Reviewed:   EKG Interpretation Date/Time:  Tuesday April 06 2023 10:01:46 EST Ventricular Rate:  62 PR Interval:  150 QRS Duration:  70 QT Interval:  386 QTC Calculation: 391 R Axis:   45  Text Interpretation: Normal sinus rhythm Normal ECG When compared with ECG of 20-Jan-2023 15:58, Nonspecific T wave abnormality now evident in Lateral leads Confirmed by Nicki Guadalajara ([id number]) on 04/06/2023 10:58:48 AM    January 20, 2023 ECG (independently read by me): NSR at 65, Nonspecific ST changes, unchanged  Recent Labs:    Latest Ref Rng & Units 10/13/2022    9:06 PM 01/07/2019    8:17 PM 05/28/2017    7:13 PM  BMP  Glucose 70 - 99 mg/dL 962  952  841   BUN 8 - 23 mg/dL 19  15  14    Creatinine 0.44 - 1.00 mg/dL 3.24  4.01  0.27   Sodium 135 - 145 mmol/L 138  141  137   Potassium 3.5 - 5.1 mmol/L 4.2  3.8  3.5   Chloride 98 - 111 mmol/L 106  107  103   CO2 22 - 32 mmol/L 24  28  24    Calcium 8.9 - 10.3 mg/dL 8.6  8.9  8.9          Latest Ref Rng & Units 05/28/2017    7:13 PM  Hepatic Function  Total Protein 6.5 - 8.1 g/dL 6.6   Albumin  3.5 - 5.0 g/dL 3.8   AST 15 - 41 U/L 94   ALT 14 - 54 U/L 86   Alk Phosphatase 38 - 126 U/L 62   Total Bilirubin 0.3 - 1.2 mg/dL 0.9        Latest Ref Rng & Units 10/13/2022    9:06 PM 01/07/2019    8:17 PM 05/28/2017    7:13 PM  CBC  WBC 4.0 - 10.5 K/uL 5.3  5.5  13.9   Hemoglobin 12.0 - 15.0 g/dL 30.8  65.7  84.6   Hematocrit 36.0 - 46.0 % 40.6  39.9  41.6   Platelets 150 - 400 K/uL 259  224  200    Lab Results  Component Value Date   MCV 90.4 10/13/2022   MCV 90.5 01/07/2019   MCV 88.2 05/28/2017   No results found for: "TSH" No results found for: "HGBA1C"   BNP No results found for: "BNP"  ProBNP No results found for: "PROBNP"   Lipid Panel  No results found for: "CHOL", "TRIG", "HDL", "CHOLHDL", "VLDL", "LDLCALC", "LDLDIRECT", "LABVLDL"   RADIOLOGY: MM 3D SCREENING MAMMOGRAM BILATERAL BREAST Result Date: 03/11/2023 CLINICAL DATA:  Screening. EXAM: DIGITAL SCREENING BILATERAL MAMMOGRAM WITH TOMOSYNTHESIS AND CAD TECHNIQUE: Bilateral screening digital craniocaudal and mediolateral oblique mammograms were obtained. Bilateral screening digital breast tomosynthesis was performed. The images were evaluated with computer-aided detection. COMPARISON:  Previous exam(s). ACR Breast Density Category c: The breasts are heterogeneously dense, which may obscure small masses. FINDINGS: There are no findings suspicious for malignancy. IMPRESSION: No mammographic evidence of malignancy. A result letter of this screening mammogram will be mailed directly to the patient. RECOMMENDATION: Screening mammogram in one year. (Code:SM-B-01Y) BI-RADS CATEGORY  1: Negative. Electronically Signed   By: Harmon Pier M.D.   On: 03/11/2023 15:47     Additional studies/ records that were reviewed today include:  Records from [facility] as well as the [facility] ER were reviewed.   ASSESSMENT:     1. Primary hypertension   2. Coronary artery calcification seen on CAT scan: 326 Agatston units   3. Pure hypercholesterolemia   4. HOCM (hypertrophic obstructive cardiomyopathy) (HCC)   5. Atrial flutter, unspecified type (HCC)   6. Gastroesophageal reflux disease, unspecified whether esophagitis present   7. Chest wall tenderness     PLAN:  Darlene Gibson is an 89 year old widowed female who has a history of hypertension, hyperlipidemia not on therapy, vitamin D deficiency on oral supplements as well as B12 deficiency on injections.  She has experienced episodes of occasional chest tightness which are not exertionally precipitated.  She had undergone a coronary calcium score which showed coronary calcification at 326 Agatston units with minimal less than 25% left main and mild proximal LAD stenosis of 25%.  She had a normal circumflex and RCA.  Chest x-ray over read showed mild degenerative changes in the thoracic spine.  There also were some areas of dependent atelectasis in her lungs.  A 2D echo Doppler study reveals normal systolic function with EF 60 to 65% and raises concern for possible HOCM with LVOT gradient at 28 mm at rest increasing to 54 mm with Valsalva maneuver.  On exam, her murmur did increase slightly going from squatting to standing position.  Nuclear PET scan was normal without scar or ischemia.  EF was 72%.  She has severe MAC and aortic atherosclerosis.  With her coronary calcification, AC, and aortic atherosclerosis, I have recommended initiation of statin therapy.  I will start  her on rosuvastatin 20 mg.  Will be following up with Dr. Aram Beecham in [place].  Her EKG today shows normal sinus rhythm at 62.  There is no LVH.  Discussed with her my plans for future retirement in mid 2025.  I will transition her to the care of Dr. Julien Nordmann in our [place] office since she is from [place] and he had read her echocardiographic study.   Medication Adjustments/Labs  and Tests Ordered: Current medicines are reviewed at length with the patient today.  Concerns regarding medicines are outlined above.  Medication changes, Labs and Tests ordered today are listed in the Patient Instructions below. Patient Instructions  Medication Instructions:  Begin Rosuvastatin 20mg . Take one tablet daily.  *If you need a refill on your cardiac medications before your next appointment, please call your pharmacy*   Lab Work: Return for fasting labs If you have labs (blood work) drawn today and your tests are completely normal, you will receive your results only by: MyChart Message (if you have MyChart) OR A paper copy in the mail If you have any lab test that is abnormal or we need to change your treatment, we will call you to review the results.   Follow-Up: At [facility], you and your health needs are our priority.  As part of our continuing mission to provide you with exceptional heart care, we have created designated Provider Care Teams.  These Care Teams include your primary Cardiologist (physician) and Advanced Practice Providers (APPs -  Physician Assistants and Nurse Practitioners) who all work together to provide you with the care you need, when you need it.  We recommend signing up for the patient portal called "MyChart".  Sign up information is provided on this After Visit Summary.  MyChart is used to connect with patients for Virtual Visits (Telemedicine).  Patients are able to view lab/test results, encounter notes, upcoming appointments, etc.  Non-urgent messages can be sent to your provider as well.   To learn more about what you can do with MyChart, go to [email or web address].    Your next appointment:   6 month(s)  Provider:   Julien Nordmann, MD       Signed, Nicki Guadalajara, MD  04/06/2023 4:52 PM    [facility] [address], [place], [place]  [address] Phone: [phone]

## 2023-04-06 NOTE — Telephone Encounter (Signed)
Patient identification verified by 2 forms. Marilynn Rail, RN    Called and spoke to patient  Patient states:   -is confused about losartan prescription  Informed patient:   -do not take losartan 50mg , discard bottle   -continue losartan-hydrochlorothiazide 100-12.5mg    -start crestor 20mg   Patient verbalized understanding, no questions at this time    Called [facility]  Pharmacy states:  -patient picked up losartan 50mg  today   -Losartan 50mg  prescribed by Dr. Tresa Endo   -Patient also has active prescription for Hyzaar  written by PA Tumey   -patient also picked up rosuvastatin and omeprazole today   RN confirmed with Dr. Tresa Endo -Patient is to continue with dose of Hyzaar and not start Losartan 50mg   -start crestor 20mg    RN called [facility] and canceled future refills on Losartan 50mg 

## 2023-12-28 ENCOUNTER — Other Ambulatory Visit: Payer: Self-pay

## 2024-02-07 ENCOUNTER — Other Ambulatory Visit: Payer: Self-pay | Admitting: Internal Medicine

## 2024-02-07 DIAGNOSIS — Z1231 Encounter for screening mammogram for malignant neoplasm of breast: Secondary | ICD-10-CM

## 2024-03-10 ENCOUNTER — Ambulatory Visit
Admission: RE | Admit: 2024-03-10 | Discharge: 2024-03-10 | Disposition: A | Discharge status: Home / Self Care | Source: Ambulatory Visit | Attending: Internal Medicine | Admitting: Internal Medicine

## 2024-03-10 DIAGNOSIS — Z1231 Encounter for screening mammogram for malignant neoplasm of breast: Secondary | ICD-10-CM | POA: Insufficient documentation
